# Patient Record
Sex: Female | Born: 1960 | Race: White | Hispanic: No | Smoking: Never smoker
Health system: Southern US, Community
[De-identification: ages and names within clinical notes are randomized; demographics above are authoritative.]

## PROBLEM LIST (undated history)

## (undated) DIAGNOSIS — E7800 Pure hypercholesterolemia, unspecified: Secondary | ICD-10-CM

## (undated) DIAGNOSIS — M797 Fibromyalgia: Secondary | ICD-10-CM

## (undated) DIAGNOSIS — M5430 Sciatica, unspecified side: Secondary | ICD-10-CM

## (undated) DIAGNOSIS — F4310 Post-traumatic stress disorder, unspecified: Secondary | ICD-10-CM

## (undated) DIAGNOSIS — F319 Bipolar disorder, unspecified: Secondary | ICD-10-CM

## (undated) DIAGNOSIS — M5137 Other intervertebral disc degeneration, lumbosacral region: Secondary | ICD-10-CM

## (undated) DIAGNOSIS — K449 Diaphragmatic hernia without obstruction or gangrene: Secondary | ICD-10-CM

## (undated) DIAGNOSIS — M51379 Other intervertebral disc degeneration, lumbosacral region without mention of lumbar back pain or lower extremity pain: Secondary | ICD-10-CM

## (undated) DIAGNOSIS — K219 Gastro-esophageal reflux disease without esophagitis: Secondary | ICD-10-CM

## (undated) DIAGNOSIS — I10 Essential (primary) hypertension: Secondary | ICD-10-CM

## (undated) DIAGNOSIS — F419 Anxiety disorder, unspecified: Secondary | ICD-10-CM

---

## 2018-04-26 ENCOUNTER — Encounter (HOSPITAL_COMMUNITY): Payer: Self-pay | Admitting: Emergency Medicine

## 2018-04-26 ENCOUNTER — Emergency Department (HOSPITAL_COMMUNITY): Payer: Medicare HMO

## 2018-04-26 ENCOUNTER — Other Ambulatory Visit: Payer: Self-pay

## 2018-04-26 ENCOUNTER — Observation Stay (HOSPITAL_COMMUNITY)
Admission: EM | Admit: 2018-04-26 | Discharge: 2018-04-28 | Disposition: A | Discharge status: Home / Self Care | Payer: Medicare HMO | Attending: Internal Medicine | Admitting: Internal Medicine

## 2018-04-26 DIAGNOSIS — M79642 Pain in left hand: Secondary | ICD-10-CM | POA: Insufficient documentation

## 2018-04-26 DIAGNOSIS — F319 Bipolar disorder, unspecified: Principal | ICD-10-CM | POA: Diagnosis present

## 2018-04-26 DIAGNOSIS — H539 Unspecified visual disturbance: Secondary | ICD-10-CM

## 2018-04-26 DIAGNOSIS — R251 Tremor, unspecified: Secondary | ICD-10-CM | POA: Diagnosis not present

## 2018-04-26 DIAGNOSIS — M797 Fibromyalgia: Secondary | ICD-10-CM | POA: Insufficient documentation

## 2018-04-26 DIAGNOSIS — I10 Essential (primary) hypertension: Secondary | ICD-10-CM | POA: Diagnosis present

## 2018-04-26 DIAGNOSIS — E876 Hypokalemia: Secondary | ICD-10-CM | POA: Diagnosis present

## 2018-04-26 DIAGNOSIS — Z79899 Other long term (current) drug therapy: Secondary | ICD-10-CM | POA: Diagnosis not present

## 2018-04-26 DIAGNOSIS — G8929 Other chronic pain: Secondary | ICD-10-CM | POA: Diagnosis not present

## 2018-04-26 DIAGNOSIS — F3010 Manic episode without psychotic symptoms, unspecified: Secondary | ICD-10-CM

## 2018-04-26 DIAGNOSIS — I160 Hypertensive urgency: Secondary | ICD-10-CM | POA: Diagnosis present

## 2018-04-26 DIAGNOSIS — R569 Unspecified convulsions: Secondary | ICD-10-CM | POA: Diagnosis not present

## 2018-04-26 DIAGNOSIS — E785 Hyperlipidemia, unspecified: Secondary | ICD-10-CM | POA: Diagnosis not present

## 2018-04-26 DIAGNOSIS — I161 Hypertensive emergency: Secondary | ICD-10-CM

## 2018-04-26 DIAGNOSIS — R000 Tachycardia, unspecified: Secondary | ICD-10-CM | POA: Diagnosis not present

## 2018-04-26 HISTORY — DX: Essential (primary) hypertension: I10

## 2018-04-26 HISTORY — DX: Bipolar disorder, unspecified: F31.9

## 2018-04-26 LAB — RAPID URINE DRUG SCREEN, HOSP PERFORMED
AMPHETAMINES: NOT DETECTED
Benzodiazepines: NOT DETECTED
Cocaine: NOT DETECTED
Opiates: NOT DETECTED
TETRAHYDROCANNABINOL: POSITIVE — AB

## 2018-04-26 LAB — URINALYSIS, ROUTINE W REFLEX MICROSCOPIC
BILIRUBIN URINE: NEGATIVE
Glucose, UA: NEGATIVE mg/dL
HGB URINE DIPSTICK: NEGATIVE
Ketones, ur: 5 mg/dL — AB
Nitrite: NEGATIVE
PROTEIN: 30 mg/dL — AB
Specific Gravity, Urine: 1.02 (ref 1.005–1.030)
pH: 5 (ref 5.0–8.0)

## 2018-04-26 LAB — CBC WITH DIFFERENTIAL/PLATELET
BASOS PCT: 0 %
Basophils Absolute: 0 10*3/uL (ref 0.0–0.1)
EOS ABS: 0 10*3/uL (ref 0.0–0.7)
Eosinophils Relative: 0 %
HCT: 44.2 % (ref 36.0–46.0)
Hemoglobin: 14.9 g/dL (ref 12.0–15.0)
LYMPHS ABS: 2.3 10*3/uL (ref 0.7–4.0)
Lymphocytes Relative: 13 %
MCH: 29.3 pg (ref 26.0–34.0)
MCHC: 33.7 g/dL (ref 30.0–36.0)
MCV: 87 fL (ref 78.0–100.0)
Monocytes Absolute: 1.3 10*3/uL — ABNORMAL HIGH (ref 0.1–1.0)
Monocytes Relative: 7 %
NEUTROS ABS: 14.9 10*3/uL — AB (ref 1.7–7.7)
NEUTROS PCT: 80 %
Platelets: 294 10*3/uL (ref 150–400)
RBC: 5.08 MIL/uL (ref 3.87–5.11)
RDW: 13.6 % (ref 11.5–15.5)
WBC: 18.6 10*3/uL — AB (ref 4.0–10.5)

## 2018-04-26 LAB — COMPREHENSIVE METABOLIC PANEL
ALBUMIN: 4.2 g/dL (ref 3.5–5.0)
ALK PHOS: 68 U/L (ref 38–126)
ALT: 34 U/L (ref 0–44)
AST: 36 U/L (ref 15–41)
Anion gap: 10 (ref 5–15)
BUN: 13 mg/dL (ref 6–20)
CALCIUM: 9.7 mg/dL (ref 8.9–10.3)
CO2: 23 mmol/L (ref 22–32)
CREATININE: 1.12 mg/dL — AB (ref 0.44–1.00)
Chloride: 109 mmol/L (ref 98–111)
GFR calc Af Amer: >60 mL/min (ref >60)
GFR calc non Af Amer: 53 mL/min — ABNORMAL LOW (ref >60)
GLUCOSE: 117 mg/dL — AB (ref 70–99)
Potassium: 2.8 mmol/L — ABNORMAL LOW (ref 3.5–5.1)
SODIUM: 142 mmol/L (ref 135–145)
Total Bilirubin: 0.7 mg/dL (ref 0.3–1.2)
Total Protein: 7.5 g/dL (ref 6.5–8.1)

## 2018-04-26 LAB — I-STAT CG4 LACTIC ACID, ED
Lactic Acid, Venous: 1.46 mmol/L (ref 0.5–1.9)
Lactic Acid, Venous: 2.86 mmol/L (ref 0.5–1.9)

## 2018-04-26 LAB — I-STAT TROPONIN, ED: TROPONIN I, POC: 0.02 ng/mL (ref 0.00–0.08)

## 2018-04-26 LAB — PROTIME-INR
INR: 0.93
Prothrombin Time: 12.3 seconds (ref 11.4–15.2)

## 2018-04-26 LAB — ETHANOL: Alcohol, Ethyl (B): <10 mg/dL (ref <10)

## 2018-04-26 LAB — MAGNESIUM: Magnesium: 2.2 mg/dL (ref 1.7–2.4)

## 2018-04-26 MED ORDER — LORAZEPAM 2 MG/ML IJ SOLN
1.0000 mg | Freq: Once | INTRAMUSCULAR | Status: AC
Start: 2018-04-26 — End: 2018-04-26
  Administered 2018-04-26: 1 mg via INTRAVENOUS
  Filled 2018-04-26: qty 1

## 2018-04-26 MED ORDER — SODIUM CHLORIDE 0.9 % IV BOLUS
1000.0000 mL | Freq: Once | INTRAVENOUS | Status: AC
  Administered 2018-04-26: 1000 mL via INTRAVENOUS

## 2018-04-26 NOTE — ED Notes (Signed)
Patient transported to CT 

## 2018-04-26 NOTE — ED Notes (Signed)
Pt back from CT scan

## 2018-04-26 NOTE — ED Notes (Signed)
Notified EDP,Tegeler,MD.pt. I-stat CG4 Lactic acid results 2.86 and RN,Sarah made aware and I-stat troponin results 0.02.

## 2018-04-26 NOTE — ED Triage Notes (Signed)
Pt here via EMS with c/o seizure like activity. EMS stated behavior was over before she got there. Pt has no noted hx of same. Pt is tachycardic at time of assessment. Pt is from [place] and was recently admitted at an impatient psych

## 2018-04-26 NOTE — ED Notes (Signed)
EDP with patient. Pt denies any pain aside from chronic pain. Provider aware of vital signs

## 2018-04-26 NOTE — ED Notes (Signed)
Pt's family is taking pt's belonging home with them.

## 2018-04-26 NOTE — ED Provider Notes (Signed)
[facility]-EMERGENCY DEPT Provider Note   CSN: [id number] Arrival date & time: 04/26/18  2027     History   Chief Complaint Chief Complaint  Patient presents with  . Manic Behavior    HPI Brandi Kerr is a 57 y.o. female.  The history is provided by the patient, a relative and medical records.  Seizures   This is a new problem. The current episode started less than 1 hour ago. The problem has been resolved. There was 1 seizure. The most recent episode lasted more than 5 minutes. Associated symptoms include visual disturbances (resolved). Pertinent negatives include no headaches, no speech difficulty, no neck stiffness, no sore throat, no chest pain, no cough, no nausea, no vomiting and no diarrhea. Characteristics include rhythmic jerking. The episode was witnessed. The seizures did not continue in the ED. The seizure(s) had no focality. Possible causes do not include missed seizure meds. There has been no fever. There were no medications administered prior to arrival.    No past medical history on file.  There are no active problems to display for this patient.   History reviewed. No pertinent surgical history.   OB History   None      Home Medications    Prior to Admission medications   Not on File    Family History No family history on file.  Social History Social History   Tobacco Use  . Smoking status: Not on file  Substance Use Topics  . Alcohol use: Not on file  . Drug use: Not on file     Allergies   Patient has no allergy information on record.   Review of Systems Review of Systems  Constitutional: Negative for chills, diaphoresis, fatigue and fever.  HENT: Negative for congestion and sore throat.   Eyes: Positive for visual disturbance (resolved). Negative for photophobia.  Respiratory: Negative for cough, chest tightness, shortness of breath, wheezing and stridor.   Cardiovascular: Negative for chest pain and  palpitations.  Gastrointestinal: Negative for abdominal pain, diarrhea, nausea and vomiting.  Genitourinary: Negative for dysuria, flank pain, frequency and urgency.  Musculoskeletal: Negative for back pain, neck pain and neck stiffness.  Skin: Negative for rash and wound.  Neurological: Positive for seizures. Negative for speech difficulty, weakness, light-headedness, numbness and headaches.  Psychiatric/Behavioral: Negative for agitation.  All other systems reviewed and are negative.    Physical Exam Updated Vital Signs BP (!) 161/106   Pulse (!) 117   Temp 98.3 F (36.8 C) (Oral)   Resp 20   SpO2 98%   Physical Exam  Constitutional: She is oriented to person, place, and time. She appears well-developed and well-nourished. No distress.  HENT:  Head: Normocephalic and atraumatic.  Mouth/Throat: Oropharynx is clear and moist. No oropharyngeal exudate.  Eyes: Pupils are equal, round, and reactive to light. Conjunctivae and EOM are normal.  Neck: Normal range of motion. Neck supple.  Cardiovascular: Regular rhythm. Tachycardia present.  No murmur heard. Pulmonary/Chest: Effort normal and breath sounds normal. No respiratory distress. She has no wheezes. She exhibits no tenderness.  Abdominal: Soft. There is no tenderness.  Musculoskeletal: She exhibits no edema or tenderness.  Lymphadenopathy:    She has no cervical adenopathy.  Neurological: She is alert and oriented to person, place, and time. She is not disoriented. She displays tremor. No cranial nerve deficit or sensory deficit. She exhibits normal muscle tone. Coordination normal. GCS eye subscore is 4. GCS verbal subscore is 5. GCS motor subscore is  6.  Patient anxious and tremulous in both upper extremities.  Skin: Skin is warm and dry. Capillary refill takes less than 2 seconds. No rash noted. She is not diaphoretic. No erythema.  Psychiatric: Her speech is rapid and/or pressured. She is not agitated and not aggressive.  She does not exhibit a depressed mood. She expresses no homicidal and no suicidal ideation.  Nursing note and vitals reviewed.    ED Treatments / Results  Labs (all labs ordered are listed, but only abnormal results are displayed) Labs Reviewed  CBC WITH DIFFERENTIAL/PLATELET - Abnormal; Notable for the following components:      Result Value   WBC 18.6 (*)    Neutro Abs 14.9 (*)    Monocytes Absolute 1.3 (*)    All other components within normal limits  COMPREHENSIVE METABOLIC PANEL - Abnormal; Notable for the following components:   Potassium 2.8 (*)    Glucose, Bld 117 (*)    Creatinine, Ser 1.12 (*)    GFR calc non Af Amer 53 (*)    All other components within normal limits  RAPID URINE DRUG SCREEN, HOSP PERFORMED - Abnormal; Notable for the following components:   Tetrahydrocannabinol POSITIVE (*)    Barbiturates   (*)    Value: Result not available. Reagent lot number recalled by manufacturer.   All other components within normal limits  URINALYSIS, ROUTINE W REFLEX MICROSCOPIC - Abnormal; Notable for the following components:   APPearance HAZY (*)    Ketones, ur 5 (*)    Protein, ur 30 (*)    Leukocytes, UA SMALL (*)    Bacteria, UA RARE (*)    All other components within normal limits  I-STAT CG4 LACTIC ACID, ED - Abnormal; Notable for the following components:   Lactic Acid, Venous 2.86 (*)    All other components within normal limits  URINE CULTURE  MAGNESIUM  ETHANOL  TSH  PROTIME-INR  I-STAT TROPONIN, ED  I-STAT CG4 LACTIC ACID, ED    EKG EKG Interpretation  Date/Time:  Wednesday April 26 2018 21:44:42 EDT Ventricular Rate:  113 PR Interval:  150 QRS Duration: 86 QT Interval:  342 QTC Calculation: 469 R Axis:   65 Text Interpretation:  Sinus tachycardia Otherwise normal ECG No prior ECG for comparison.  no STEMI Confirmed by Theda Belfast ([id number]) on 04/26/2018 10:06:46 PM   Radiology Dg Chest 2 View  Result Date: 04/27/2018 CLINICAL DATA:   57 year old female with tachycardia. EXAM: CHEST - 2 VIEW COMPARISON:  None. FINDINGS: The heart size and mediastinal contours are within normal limits. Both lungs are clear. The visualized skeletal structures are unremarkable. IMPRESSION: No active cardiopulmonary disease. Electronically Signed   By: Elgie Collard M.D.   On: 04/27/2018 00:02   Ct Head Wo Contrast  Result Date: 04/26/2018 CLINICAL DATA:  57 year old female with seizures. EXAM: CT HEAD WITHOUT CONTRAST TECHNIQUE: Contiguous axial images were obtained from the base of the skull through the vertex without intravenous contrast. COMPARISON:  None. FINDINGS: Brain: The ventricles and sulci appropriate size for patient's age. Minimal periventricular and deep white matter chronic microvascular ischemic changes noted. There is no acute intracranial hemorrhage. No mass effect or midline shift. No extra-axial fluid collection. Vascular: No hyperdense vessel or unexpected calcification. Skull: Normal. Negative for fracture or focal lesion. Sinuses/Orbits: No acute finding.  Right scleral band. Other: None IMPRESSION: No acute intracranial pathology. Electronically Signed   By: Elgie Collard M.D.   On: 04/26/2018 23:55    Procedures Procedures (including  critical care time)  Medications Ordered in ED Medications  LORazepam (ATIVAN) injection 1 mg (1 mg Intravenous Given 04/26/18 2250)  sodium chloride 0.9 % bolus 1,000 mL (0 mLs Intravenous Stopped 04/27/18 0041)  labetalol (NORMODYNE,TRANDATE) injection 10 mg (10 mg Intravenous Given 04/27/18 0037)     Initial Impression / Assessment and Plan / ED Course  I have reviewed the triage vital signs and the nursing notes.  Pertinent labs & imaging results that were available during my care of the patient were reviewed by me and considered in my medical decision making (see chart for details).     Megan MansLynn Coop is a 57 y.o. female with a past medical history significant for bipolar disorder,  hypertension, recent UTI status post antibiotics, and recent discharge for manic episode who presents with witnessed seizure.  Patient presents via EMS today for seizure episode.  According to patient, patient is from [place] and was discharged [facility] yesterday at 3 PM.  During her 5-day stay, patient was treated for manic episode and was also found to have a urinary tract infection.  Patient reports that she took a one-time antibiotic to treat this.  She says that she also has elevated blood pressures which is being managed with medications.  She reports that she has been taking her medications as directed.  Patient says that yesterday evening, patient began having flashes in her vision.  She went to the emergency department and was told it may be from blood pressure.  Patient was then discharged home.  Patient reports that she left [place] today and recently arrived to [place] with a friend.  While at home this afternoon, patient had a 10-minute seizure where the patient tensed up, phone with her mouth, eyes rolled back, and was unresponsive.  Friend reports that by the time EMS arrived, patient's seizure had stopped and she was postictal.  EMS report the patient was postictal during transport.  Patient was found to be hypertensive with blood pressures in the 180s and 190s as well as tachycardic in the 130s.  Patient denies any headache or vision changes at this time but is concerned about the seizure and recent vision change.  Patient says that she has not taken any drugs and does not drink alcohol.  She says she does not drink alcohol in 18 months.  Patient denies any other fevers, chills, neck pain, neck stiffness, traumatic injuries, chest pain, shortness breath, cough, nausea, vomiting, conservation, diarrhea, dysuria, or any other neurologic complaints.  On exam, patient is tachycardic and tachypneic.  Patient is very jittery and has tremors.  Patient does appear somewhat  hypomanic although she is able to answer questions appropriately.  Patient repeats herself and has some Kerr of ideas.  Family reports that she is very coherent for her at this time answering questions appropriately.    Lungs are clear and chest is nontender.  Patient had no focal neurologic deficits aside from the tremors.  Clinical I am concerned about hypertensive emergency causing her recent vision changes and possibly the seizure today.  As patient is never had a seizure before, patient will have CT head and laboratory testing.  Patient will also have work-up including UDS and alcohol despite patient reporting no episodes of drinking or drugs.  Given the vital sign abnormalities and her recent 10-minute seizure, anticipate patient will require admission for further work-up and management.  12:16 AM On my reassessment, patient again has a blood pressure in the 180s and is tachycardic  in the 110's despite fluid.  She will be given labetalol as I am concerned about hypertensive emergency contributing to symptoms.  Patient's other work-up began to return.  CT of the head showed no acute abnormalities and chest x-ray shows no pneumonia.  Lactic acid was initially elevated but then normalized after fluids.  INR not elevated.  Urinalysis shows no nitrites.  There were leukocytes and bacteria but there were also squamous cells and mucus.  I am concerned about contamination over UTI at this time in the absence of her urinary symptoms.  Troponin was not elevated.  CBC showed a leukocytosis however this may be demargination from the seizure.  Lactic acid also could be explained by the seizure.  Hemoglobin was normal.  Metabolic panel showed hypokalemia.  Patient will be given oral potassium.  Magnesium was normal and alcohol was negative.   Given the patient's persistent hypertension, her report of transient flashes in her vision and a 10-minute seizure today with no history of seizures as well as this  occurring while on Lamictal, I am concerned about the patient being discharged home.  Patient also was displaying signs of mild mania and may benefit from psychiatry evaluate her after she is medically cleared.  Given the patient's lack of headache or neck pain/neck stiffness, I have a lower suspicion for meningitis or infectious encephalopathy causing her seizure.  Hospitalist team will be called for admission for further management of Neurologic symptoms after hypertensive emergency.  12:30 AM Neurology was called and he wants to evaluate her personally to determine if she needs short-term or long-term EEG monitoring tonight.  This will determine which hospital she can be admitted to.  On my reassessment, patient appears more relaxed after the Ativan for her CT scan.  1:29 AM Neurology came to the bedside and feels she does not need long-term EEG monitoring.  They feel she needs short-term EEG and likely MRI in the morning.  Due to patient's claustrophobia, patient may need sedation for her MRI which can be arranged as an inpatient.  Neurology recommend continuing all home medicines.  Patient will be admitted to hospital service [facility] for further management of her hypertensive emergency and new seizure.   Final Clinical Impressions(s) / ED Diagnoses   Final diagnoses:  Seizures (HCC)  Manic behavior (HCC)  Transient vision disturbance  Hypertensive emergency  Hypokalemia     Clinical Impression: 1. Seizures (HCC)   2. Manic behavior (HCC)   3. Transient vision disturbance   4. Hypertensive emergency   5. Hypokalemia     Disposition: Admit  This note was prepared with assistance of Dragon voice recognition software. Occasional wrong-word or sound-a-like substitutions may have occurred due to the inherent limitations of voice recognition software.     Cassie Henkels, Canary Brim, MD 04/27/18 [phone]

## 2018-04-26 NOTE — ED Notes (Signed)
Bed: [id number] Expected date:  Expected time:  Means of arrival:  Comments: rm 29

## 2018-04-26 NOTE — ED Notes (Addendum)
CT tech attempted to take pt to CT scan; however, pt requested that she needed more time for Ativan to "kick in"

## 2018-04-27 ENCOUNTER — Observation Stay (HOSPITAL_COMMUNITY): Payer: Medicare HMO

## 2018-04-27 ENCOUNTER — Encounter (HOSPITAL_COMMUNITY): Payer: Self-pay | Admitting: Internal Medicine

## 2018-04-27 ENCOUNTER — Other Ambulatory Visit: Payer: Self-pay

## 2018-04-27 ENCOUNTER — Observation Stay (HOSPITAL_BASED_OUTPATIENT_CLINIC_OR_DEPARTMENT_OTHER): Payer: Medicare HMO

## 2018-04-27 DIAGNOSIS — F1290 Cannabis use, unspecified, uncomplicated: Secondary | ICD-10-CM

## 2018-04-27 DIAGNOSIS — R569 Unspecified convulsions: Secondary | ICD-10-CM | POA: Diagnosis not present

## 2018-04-27 DIAGNOSIS — I160 Hypertensive urgency: Secondary | ICD-10-CM | POA: Diagnosis present

## 2018-04-27 DIAGNOSIS — I10 Essential (primary) hypertension: Secondary | ICD-10-CM | POA: Diagnosis present

## 2018-04-27 DIAGNOSIS — F319 Bipolar disorder, unspecified: Secondary | ICD-10-CM | POA: Diagnosis present

## 2018-04-27 DIAGNOSIS — H539 Unspecified visual disturbance: Secondary | ICD-10-CM | POA: Diagnosis not present

## 2018-04-27 DIAGNOSIS — I5030 Unspecified diastolic (congestive) heart failure: Secondary | ICD-10-CM | POA: Diagnosis not present

## 2018-04-27 DIAGNOSIS — Z736 Limitation of activities due to disability: Secondary | ICD-10-CM

## 2018-04-27 DIAGNOSIS — Z79899 Other long term (current) drug therapy: Secondary | ICD-10-CM

## 2018-04-27 DIAGNOSIS — F3173 Bipolar disorder, in partial remission, most recent episode manic: Secondary | ICD-10-CM | POA: Diagnosis not present

## 2018-04-27 LAB — TSH: TSH: 0.757 u[IU]/mL (ref 0.350–4.500)

## 2018-04-27 LAB — ECHOCARDIOGRAM COMPLETE
Height: 67 in
Weight: 3739.2 oz

## 2018-04-27 LAB — GLUCOSE, CAPILLARY: Glucose-Capillary: 92 mg/dL (ref 70–99)

## 2018-04-27 MED ORDER — DULOXETINE HCL 60 MG PO CPEP
60.0000 mg | ORAL_CAPSULE | Freq: Every day | ORAL | Status: DC
  Administered 2018-04-27 – 2018-04-28 (×2): 60 mg via ORAL
  Filled 2018-04-27 (×2): qty 1

## 2018-04-27 MED ORDER — LAMOTRIGINE 100 MG PO TABS
200.0000 mg | ORAL_TABLET | Freq: Every day | ORAL | Status: DC
  Administered 2018-04-27 (×2): 200 mg via ORAL
  Filled 2018-04-27 (×2): qty 2

## 2018-04-27 MED ORDER — LOSARTAN POTASSIUM 50 MG PO TABS
100.0000 mg | ORAL_TABLET | Freq: Every day | ORAL | Status: DC
  Administered 2018-04-27 – 2018-04-28 (×2): 100 mg via ORAL
  Filled 2018-04-27 (×2): qty 2

## 2018-04-27 MED ORDER — TRAMADOL HCL 50 MG PO TABS
50.0000 mg | ORAL_TABLET | Freq: Two times a day (BID) | ORAL | Status: DC | PRN
  Administered 2018-04-27 – 2018-04-28 (×2): 50 mg via ORAL
  Filled 2018-04-27 (×3): qty 1

## 2018-04-27 MED ORDER — SODIUM CHLORIDE 0.9% FLUSH
3.0000 mL | Freq: Two times a day (BID) | INTRAVENOUS | Status: DC
  Administered 2018-04-27 – 2018-04-28 (×4): 3 mL via INTRAVENOUS

## 2018-04-27 MED ORDER — LABETALOL HCL 5 MG/ML IV SOLN
10.0000 mg | Freq: Once | INTRAVENOUS | Status: AC
  Administered 2018-04-27: 10 mg via INTRAVENOUS
  Filled 2018-04-27: qty 4

## 2018-04-27 MED ORDER — LORAZEPAM 2 MG/ML IJ SOLN
2.0000 mg | Freq: Once | INTRAMUSCULAR | Status: AC
  Administered 2018-04-27: 2 mg via INTRAVENOUS
  Filled 2018-04-27: qty 1

## 2018-04-27 MED ORDER — ROSUVASTATIN CALCIUM 20 MG PO TABS
20.0000 mg | ORAL_TABLET | Freq: Every day | ORAL | Status: DC
  Administered 2018-04-27: 20 mg via ORAL
  Filled 2018-04-27 (×2): qty 1

## 2018-04-27 MED ORDER — LABETALOL HCL 5 MG/ML IV SOLN
10.0000 mg | INTRAVENOUS | Status: DC | PRN
  Filled 2018-04-27: qty 4

## 2018-04-27 MED ORDER — GADOBENATE DIMEGLUMINE 529 MG/ML IV SOLN
20.0000 mL | Freq: Once | INTRAVENOUS | Status: AC | PRN
  Administered 2018-04-27: 20 mL via INTRAVENOUS

## 2018-04-27 MED ORDER — ENOXAPARIN SODIUM 40 MG/0.4ML ~~LOC~~ SOLN
40.0000 mg | SUBCUTANEOUS | Status: DC
  Administered 2018-04-27 – 2018-04-28 (×2): 40 mg via SUBCUTANEOUS
  Filled 2018-04-27 (×2): qty 0.4

## 2018-04-27 MED ORDER — AMLODIPINE BESYLATE 5 MG PO TABS
5.0000 mg | ORAL_TABLET | Freq: Every day | ORAL | Status: DC
  Administered 2018-04-27 – 2018-04-28 (×2): 5 mg via ORAL
  Filled 2018-04-27 (×2): qty 1

## 2018-04-27 NOTE — ED Notes (Signed)
ED TO INPATIENT HANDOFF REPORT  Name/Age/Gender Clarene Duke 57 y.o. female  Code Status    Code Status Orders  (From admission, onward)        Start     Ordered   04/27/18 0155  Full code  Continuous     04/27/18 0157    Code Status History    This patient has a current code status but no historical code status.      Home/SNF/Other Home  Chief Complaint Seizures  Level of Care/Admitting Diagnosis ED Disposition    ED Disposition Condition Comment   Admit  Hospital Area: [place] [[id number]]  Level of Care: Telemetry [5]  Admit to tele based on following criteria: Eval of Syncope  Diagnosis: Hypertensive urgency [675916]  Admitting Physician: Etta Quill [[id number]]  Attending Physician: Etta Quill [[id number]]  PT Class (Do Not Modify): Observation [104]  PT Acc Code (Do Not Modify): Observation [10022]       Medical History Past Medical History:  Diagnosis Date  . Bipolar disorder ([place])   . HTN (hypertension)     Allergies No Known Allergies  IV Location/Drains/Wounds Patient Lines/Drains/Airways Status   Active Line/Drains/Airways    Name:   Placement date:   Placement time:   Site:   Days:   Peripheral IV 04/26/18 Right Antecubital   04/26/18    2201    Antecubital   1          Labs/Imaging Results for orders placed or performed during the hospital encounter of 04/26/18 (from the past 48 hour(s))  Ethanol     Status: None   Collection Time: 04/26/18  9:12 PM  Result Value Ref Range   Alcohol, Ethyl (B) <10 <10 mg/dL    Comment: (NOTE) Lowest detectable limit for serum alcohol is 10 mg/dL. For medical purposes only. Performed at [facility], [place] [address] [place], [place] [address]   TSH     Status: None   Collection Time: 04/26/18  9:12 PM  Result Value Ref Range   TSH 0.757 0.350 - 4.500 uIU/mL    Comment: Performed by a 3rd Generation assay with a functional sensitivity of <=0.01  uIU/mL. Performed at [facility], [place] [address] [place], [place] [address]   CBC with Differential     Status: Abnormal   Collection Time: 04/26/18 10:00 PM  Result Value Ref Range   WBC 18.6 (H) 4.0 - 10.5 K/uL   RBC 5.08 3.87 - 5.11 MIL/uL   Hemoglobin 14.9 12.0 - 15.0 g/dL   HCT 44.2 36.0 - 46.0 %   MCV 87.0 78.0 - 100.0 fL   MCH 29.3 26.0 - 34.0 pg   MCHC 33.7 30.0 - 36.0 g/dL   RDW 13.6 11.5 - 15.5 %   Platelets 294 150 - 400 K/uL   Neutrophils Relative % 80 %   Neutro Abs 14.9 (H) 1.7 - 7.7 K/uL   Lymphocytes Relative 13 %   Lymphs Abs 2.3 0.7 - 4.0 K/uL   Monocytes Relative 7 %   Monocytes Absolute 1.3 (H) 0.1 - 1.0 K/uL   Eosinophils Relative 0 %   Eosinophils Absolute 0.0 0.0 - 0.7 K/uL   Basophils Relative 0 %   Basophils Absolute 0.0 0.0 - 0.1 K/uL    Comment: Performed at [facility], [place] [address] [place], [place] [address]  Comprehensive metabolic panel     Status: Abnormal   Collection Time: 04/26/18 10:00 PM  Result Value Ref Range   Sodium 142 135 - 145 mmol/L   Potassium 2.8 (L) 3.5 - 5.1 mmol/L   Chloride 109 98 - 111 mmol/L    Comment: Please note change in reference range.   CO2 23 22 - 32 mmol/L   Glucose, Bld 117 (H) 70 - 99 mg/dL    Comment: Please note change in reference range.   BUN 13 6 - 20 mg/dL    Comment: Please note change in reference range.   Creatinine, Ser 1.12 (H) 0.44 - 1.00 mg/dL   Calcium 9.7 8.9 - 10.3 mg/dL   Total Protein 7.5 6.5 - 8.1 g/dL   Albumin 4.2 3.5 - 5.0 g/dL   AST 36 15 - 41 U/L   ALT 34 0 - 44 U/L    Comment: Please note change in reference range.   Alkaline Phosphatase 68 38 - 126 U/L   Total Bilirubin 0.7 0.3 - 1.2 mg/dL   GFR calc non Af Amer 53 (L) >60 mL/min   GFR calc Af Amer >60 >60 mL/min    Comment: (NOTE) The eGFR has been calculated using the CKD EPI equation. This calculation has not been validated in all clinical situations. eGFR's persistently <60  mL/min signify possible Chronic Kidney Disease.    Anion gap 10 5 - 15    Comment: Performed at [facility], [place] [address] [place], [place] [address]  Magnesium     Status: None   Collection Time: 04/26/18 10:00 PM  Result Value Ref Range   Magnesium 2.2 1.7 - 2.4 mg/dL    Comment: Performed at [facility], [place] [address] [place], [place] [address]  I-Stat Troponin, ED (not at [facility])     Status: None   Collection Time: 04/26/18 10:10 PM  Result Value Ref Range   Troponin i, poc 0.02 0.00 - 0.08 ng/mL   Comment 3            Comment: Due to the release kinetics of cTnI, a negative result within the first hours of the onset of symptoms does not rule out myocardial infarction with certainty. If myocardial infarction is still suspected, repeat the test at appropriate intervals.   I-Stat CG4 Lactic Acid, ED     Status: Abnormal   Collection Time: 04/26/18 10:11 PM  Result Value Ref Range   Lactic Acid, Venous 2.86 (HH) 0.5 - 1.9 mmol/L   Comment NOTIFIED PHYSICIAN   Rapid urine drug screen (hospital performed)     Status: Abnormal   Collection Time: 04/26/18 10:36 PM  Result Value Ref Range   Opiates NONE DETECTED NONE DETECTED   Cocaine NONE DETECTED NONE DETECTED   Benzodiazepines NONE DETECTED NONE DETECTED   Amphetamines NONE DETECTED NONE DETECTED   Tetrahydrocannabinol POSITIVE (A) NONE DETECTED   Barbiturates (A) NONE DETECTED    Result not available. Reagent lot number recalled by manufacturer.    Comment: Performed at [facility], [place] [address], [place], [place] [address]  Urinalysis, Routine w reflex microscopic     Status: Abnormal   Collection Time: 04/26/18 10:36 PM  Result Value Ref Range   Color, Urine YELLOW YELLOW   APPearance HAZY (A) CLEAR   Specific Gravity, Urine 1.020 1.005 - 1.030   pH 5.0 5.0 - 8.0   Glucose, UA NEGATIVE NEGATIVE mg/dL   Hgb urine dipstick NEGATIVE NEGATIVE   Bilirubin Urine  NEGATIVE NEGATIVE   Ketones, ur 5 (A) NEGATIVE mg/dL  Protein, ur 30 (A) NEGATIVE mg/dL   Nitrite NEGATIVE NEGATIVE   Leukocytes, UA SMALL (A) NEGATIVE   RBC / HPF 0-5 0 - 5 RBC/hpf   WBC, UA 11-20 0 - 5 WBC/hpf   Bacteria, UA RARE (A) NONE SEEN   Squamous Epithelial / LPF 6-10 0 - 5   Mucus PRESENT    Hyaline Casts, UA PRESENT     Comment: Performed at [facility], [place] [address], [place], [place] [address]  Protime-INR     Status: None   Collection Time: 04/26/18 10:46 PM  Result Value Ref Range   Prothrombin Time 12.3 11.4 - 15.2 seconds   INR 0.93     Comment: Performed at [facility], [place] [address] [place],  [address]  I-Stat CG4 Lactic Acid, ED     Status: None   Collection Time: 04/26/18 11:41 PM  Result Value Ref Range   Lactic Acid, Venous 1.46 0.5 - 1.9 mmol/L   Dg Chest 2 View  Result Date: 04/27/2018 CLINICAL DATA:  57 year old female with tachycardia. EXAM: CHEST - 2 VIEW COMPARISON:  None. FINDINGS: The heart size and mediastinal contours are within normal limits. Both lungs are clear. The visualized skeletal structures are unremarkable. IMPRESSION: No active cardiopulmonary disease. Electronically Signed   By: Anner Crete M.D.   On: 04/27/2018 00:02   Ct Head Wo Contrast  Result Date: 04/26/2018 CLINICAL DATA:  57 year old female with seizures. EXAM: CT HEAD WITHOUT CONTRAST TECHNIQUE: Contiguous axial images were obtained from the base of the skull through the vertex without intravenous contrast. COMPARISON:  None. FINDINGS: Brain: The ventricles and sulci appropriate size for patient's age. Minimal periventricular and deep white matter chronic microvascular ischemic changes noted. There is no acute intracranial hemorrhage. No mass effect or midline shift. No extra-axial fluid collection. Vascular: No hyperdense vessel or unexpected calcification. Skull: Normal. Negative for fracture or focal lesion. Sinuses/Orbits:  No acute finding.  Right scleral band. Other: None IMPRESSION: No acute intracranial pathology. Electronically Signed   By: Anner Crete M.D.   On: 04/26/2018 23:55    Pending Labs Unresulted Labs (From admission, onward)   Start     Ordered   04/27/18 0155  HIV antibody (Routine Testing)  Once,   R     04/27/18 0157   04/26/18 2112  Urine culture  STAT,   STAT     04/26/18 2111      Vitals/Pain Today's Vitals   04/27/18 0145 04/27/18 0204 04/27/18 0205 04/27/18 0206  BP: 137/72 140/73    Pulse:   (!) 104   Resp: 20 17 (!) 22   Temp:    98.3 F (36.8 C)  TempSrc:    Oral  SpO2:   97%   PainSc:        Isolation Precautions No active isolations  Medications Medications  labetalol (NORMODYNE,TRANDATE) injection 10 mg (has no administration in time range)  lamoTRIgine (LAMICTAL) tablet 200 mg (has no administration in time range)  DULoxetine (CYMBALTA) DR capsule 60 mg (has no administration in time range)  amLODipine (NORVASC) tablet 5 mg (has no administration in time range)  losartan (COZAAR) tablet 100 mg (has no administration in time range)  rosuvastatin (CRESTOR) tablet 20 mg (has no administration in time range)  sodium chloride flush (NS) 0.9 % injection 3 mL (has no administration in time range)  enoxaparin (LOVENOX) injection 40 mg (has no administration in time range)  LORazepam (ATIVAN) injection 1 mg (1  mg Intravenous Given 04/26/18 2250)  sodium chloride 0.9 % bolus 1,000 mL (0 mLs Intravenous Stopped 04/27/18 0041)  labetalol (NORMODYNE,TRANDATE) injection 10 mg (10 mg Intravenous Given 04/27/18 0037)    Mobility walks with person assist

## 2018-04-27 NOTE — Progress Notes (Deleted)
Subjective: Patient very drowsy from Ativan due to having MRI this AM. No complaints. No further episode.   Exam: Vitals:   04/27/18 0301 04/27/18 0603  BP: 129/85 (!) 152/93  Pulse: 92 98  Resp: 19 18  Temp: 98.7 F (37.1 C) 98.3 F (36.8 C)  SpO2: 92% 95%    Physical Exam   HEENT-  Normocephalic, no lesions, without obvious abnormality.  Normal external eye and conjunctiva.   Extremities- Warm, dry and intact Musculoskeletal-no joint tenderness, deformity or swelling Skin-warm and dry, no hyperpigmentation, vitiligo, or suspicious lesions    Neuro:  Mental Status: Alert, oriented, thought content appropriate.  Speech fluent without evidence of aphasia.  Able to follow 3 step commands without difficulty. Cranial Nerves: II:  Visual fields grossly normal,  III,IV, VI: ptosis not present, extra-ocular motions intact bilaterally pupils equal, round, reactive to light and accommodation V,VII: smile symmetric, facial light touch sensation normal bilaterally VIII: hearing normal bilaterally IX,X: uvula rises symmetrically XI: bilateral shoulder shrug XII: midline tongue extension Motor:- no significant changes on motor exam  5/5 strength in all 4 extremities with mildly increased tone and tremor noted on the right and left hand but left worse than tight, baseline for her.  She did have some cogwheeling versus paratonia in the left upper extremity.  Tone was normal in all other extremities. Sensory: Pinprick and light touch intact throughout, bilaterally Deep Tendon Reflexes: 2+ and symmetric throughout Plantars: Right: downgoing   Left: downgoing Cerebellar: normal finger-to-nose,and normal heel-to-shin test   Medications:  Scheduled: . amLODipine  5 mg Oral Daily  . DULoxetine  60 mg Oral Daily  . enoxaparin (LOVENOX) injection  40 mg Subcutaneous Q24H  . lamoTRIgine  200 mg Oral QHS  . losartan  100 mg Oral Daily  . rosuvastatin  20 mg Oral Daily  . sodium chloride  flush  3 mL Intravenous Q12H    Pertinent Labs/Diagnostics: MRI WNL EEG pending  Dg Chest 2 View  Result Date: 04/27/2018 CLINICAL DATA:  57 year old female with tachycardia. EXAM: CHEST - 2 VIEW COMPARISON:  None. FINDINGS: The heart size and mediastinal contours are within normal limits. Both lungs are clear. The visualized skeletal structures are unremarkable. IMPRESSION: No active cardiopulmonary disease. Electronically Signed   By: Elgie CollardArash  Radparvar M.D.   On: 04/27/2018 00:02   Ct Head Wo Contrast  Result Date: 04/26/2018 CLINICAL DATA:  57 year old female with seizures. EXAM: CT HEAD WITHOUT CONTRAST TECHNIQUE: Contiguous axial images were obtained from the base of the skull through the vertex without intravenous contrast. COMPARISON:  None. FINDINGS: Brain: The ventricles and sulci appropriate size for patient's age. Minimal periventricular and deep white matter chronic microvascular ischemic changes noted. There is no acute intracranial hemorrhage. No mass effect or midline shift. No extra-axial fluid collection. Vascular: No hyperdense vessel or unexpected calcification. Skull: Normal. Negative for fracture or focal lesion. Sinuses/Orbits: No acute finding.  Right scleral band. Other: None IMPRESSION: No acute intracranial pathology. Electronically Signed   By: Elgie CollardArash  Radparvar M.D.   On: 04/26/2018 23:55   Mr Laqueta JeanBrain W NWWo Contrast  Result Date: 04/27/2018 CLINICAL DATA:  Seizure like activity. EXAM: MRI HEAD WITHOUT AND WITH CONTRAST TECHNIQUE: Multiplanar, multiecho pulse sequences of the brain and surrounding structures were obtained without and with intravenous contrast. CONTRAST:  20mL MULTIHANCE GADOBENATE DIMEGLUMINE 529 MG/ML IV SOLN COMPARISON:  Head CT 04/26/2018 FINDINGS: The study is moderately motion degraded. Brain: There is no evidence of acute infarct, intracranial hemorrhage, mass, midline shift,  or extra-axial fluid collection. The ventricles and sulci are normal. The  hippocampi are grossly symmetric in size and signal, however detailed assessment is precluded by the degree of motion artifact. No significant brain parenchymal signal abnormality is identified. No abnormal enhancement is identified. Vascular: Major intracranial vascular flow voids are preserved. Skull and upper cervical spine: Unremarkable bone marrow signal. Sinuses/Orbits: Right scleral buckle. Paranasal sinuses and mastoid air cells are clear. Other: None. IMPRESSION: Negative brain MRI within limitations of motion. Electronically Signed   By: Sebastian Ache M.D.   On: 04/27/2018 09:42     Felicie Morn PA-C [facility] [phone]   Assessment:--no change  57 year old woman with a past medical history of bipolar disorder with recent psychiatric admission for mania, currently with somatic symptoms, presented to the emergency room for evaluation of seizure-like episode at a friend's place. The description does sound concerning for a seizure but there was an absence of any tongue biting or bowel bladder incontinence which with a history of psychiatric disroder, can also point towards a non-organic cause of the seizure. Nonetheless, she is on medications that might lead lower seizure threshold-Cymbalta. She needs work-up for seizures.  Recommendations: --EEG --seizure precautions --IF further seizures likely will need alternative than Cymbalta for her fibromyalgia --Per [facility] statutes, patients with seizures are not allowed to drive until  they have been seizure-free for six months. Use caution when using heavy equipment or power tools. Avoid working on ladders or at heights. Take showers instead of baths. Ensure the water temperature is not too high on the home water heater. Do not go swimming alone. When caring for infants or small children, sit down when holding, feeding, or changing them to minimize risk of injury to the child in the event you have a seizure.       04/27/2018, 9:58 AM

## 2018-04-27 NOTE — Progress Notes (Addendum)
Brandi Kerr is a 57 y.o. female with medical history significant of bipolar disorder currently experiencing manic symptoms for which she was hospitalized in [place] and discharged yesterday, HTN, HLD.  [facility] seizure like event.  Manic episode has resolved and is cleared by psych.  Highly appreciate recommendations.  Continue current medications.  Please refer to H&P dictated by Dr. Julian ReilGardner on 04/27/2018 for further details of the assessment and plan.

## 2018-04-27 NOTE — Progress Notes (Signed)
Subjective: Patient very drowsy from Ativan due to having MRI this AM. No complaints. No further episode.   Exam: Vitals:   04/27/18 0301 04/27/18 0603  BP: 129/85 (!) 152/93  Pulse: 92 98  Resp: 19 18  Temp: 98.7 F (37.1 C) 98.3 F (36.8 C)  SpO2: 92% 95%    Physical Exam   HEENT-  Normocephalic, no lesions, without obvious abnormality.  Normal external eye and conjunctiva.   Extremities- Warm, dry and intact Musculoskeletal-no joint tenderness, deformity or swelling Skin-warm and dry, no hyperpigmentation, vitiligo, or suspicious lesions    Neuro:  Mental Status: Alert, oriented, thought content appropriate.  Speech fluent without evidence of aphasia.  Able to follow 3 step commands without difficulty. Cranial Nerves: II:  Visual fields grossly normal,  III,IV, VI: ptosis not present, extra-ocular motions intact bilaterally pupils equal, round, reactive to light and accommodation V,VII: smile symmetric, facial light touch sensation normal bilaterally VIII: hearing normal bilaterally IX,X: uvula rises symmetrically XI: bilateral shoulder shrug XII: midline tongue extension Motor:- no significant changes on motor exam  5/5 strength in all 4 extremities with mildly increased tone and tremor noted on the right and left hand but left worse than tight, baseline for her.  She did have some cogwheeling versus paratonia in the left upper extremity.  Tone was normal in all other extremities. Sensory: Pinprick and light touch intact throughout, bilaterally Deep Tendon Reflexes: 2+ and symmetric throughout Plantars: Right: downgoing   Left: downgoing Cerebellar: normal finger-to-nose,and normal heel-to-shin test   Medications:  Scheduled: . amLODipine  5 mg Oral Daily  . DULoxetine  60 mg Oral Daily  . enoxaparin (LOVENOX) injection  40 mg Subcutaneous Q24H  . lamoTRIgine  200 mg Oral QHS  . losartan  100 mg Oral Daily  . rosuvastatin  20 mg Oral Daily  . sodium chloride  flush  3 mL Intravenous Q12H    Pertinent Labs/Diagnostics: MRI WNL EEG pending  Dg Chest 2 View  Result Date: 04/27/2018 CLINICAL DATA:  57 year old female with tachycardia. EXAM: CHEST - 2 VIEW COMPARISON:  None. FINDINGS: The heart size and mediastinal contours are within normal limits. Both lungs are clear. The visualized skeletal structures are unremarkable. IMPRESSION: No active cardiopulmonary disease. Electronically Signed   By: Elgie CollardArash  Radparvar M.D.   On: 04/27/2018 00:02   Ct Head Wo Contrast  Result Date: 04/26/2018 CLINICAL DATA:  57 year old female with seizures. EXAM: CT HEAD WITHOUT CONTRAST TECHNIQUE: Contiguous axial images were obtained from the base of the skull through the vertex without intravenous contrast. COMPARISON:  None. FINDINGS: Brain: The ventricles and sulci appropriate size for patient's age. Minimal periventricular and deep white matter chronic microvascular ischemic changes noted. There is no acute intracranial hemorrhage. No mass effect or midline shift. No extra-axial fluid collection. Vascular: No hyperdense vessel or unexpected calcification. Skull: Normal. Negative for fracture or focal lesion. Sinuses/Orbits: No acute finding.  Right scleral band. Other: None IMPRESSION: No acute intracranial pathology. Electronically Signed   By: Elgie CollardArash  Radparvar M.D.   On: 04/26/2018 23:55   Mr Laqueta JeanBrain W NWWo Contrast  Result Date: 04/27/2018 CLINICAL DATA:  Seizure like activity. EXAM: MRI HEAD WITHOUT AND WITH CONTRAST TECHNIQUE: Multiplanar, multiecho pulse sequences of the brain and surrounding structures were obtained without and with intravenous contrast. CONTRAST:  20mL MULTIHANCE GADOBENATE DIMEGLUMINE 529 MG/ML IV SOLN COMPARISON:  Head CT 04/26/2018 FINDINGS: The study is moderately motion degraded. Brain: There is no evidence of acute infarct, intracranial hemorrhage, mass, midline shift,  or extra-axial fluid collection. The ventricles and sulci are normal. The  hippocampi are grossly symmetric in size and signal, however detailed assessment is precluded by the degree of motion artifact. No significant brain parenchymal signal abnormality is identified. No abnormal enhancement is identified. Vascular: Major intracranial vascular flow voids are preserved. Skull and upper cervical spine: Unremarkable bone marrow signal. Sinuses/Orbits: Right scleral buckle. Paranasal sinuses and mastoid air cells are clear. Other: None. IMPRESSION: Negative brain MRI within limitations of motion. Electronically Signed   By: Sebastian AcheAllen  Grady M.D.   On: 04/27/2018 09:42       Assessment:--no change  57 year old woman with a past medical history of bipolar disorder with recent psychiatric admission for mania, currently with somatic symptoms, presented to the emergency room for evaluation of seizure-like episode at a friend's place. The description does sound concerning for a seizure but there was an absence of any tongue biting or bowel bladder incontinence which with a history of psychiatric disroder, can also point towards a non-organic cause of the seizure. Nonetheless, she is on medications that might lead lower seizure threshold-Cymbalta. She needs work-up for seizures.  Recommendations: --EEG --seizure precautions --IF further seizures likely will need alternative than Cymbalta for her fibromyalgia --[place] [facility] [place], patients with seizures are not allowed to drive until  they have been seizure-free for six months. Use caution when using heavy equipment or power tools. Avoid working on ladders or at heights. Take showers instead of baths. Ensure the water temperature is not too high on the home water heater. Do not go swimming alone. When caring for infants or small children, sit down when holding, feeding, or changing them to minimize risk of injury to the child in the event you have a seizure.    Felicie MornDavid Tonio Seider PA-C [facility] [phone]   04/27/2018, 9:58 AM

## 2018-04-27 NOTE — Consult Note (Addendum)
Neurology Consultation  Reason for Consult: Seizure Referring Physician: C. Tegeler, MD  CC: Altered mental status, seizure-like episode  History is obtained from: Patient, patient's friend at bedside  HPI: Brandi Kerr is a 57 y.o. female past medical history of bipolar disorder currently experiencing manic symptoms for which she was hospitalized in [place] and discharged yesterday, hypertension and hyperlipidemia, presented to the emergency room for evaluation of a seizure-like event. She [place] to [place] this evening, they were having dinner together in her house at her kitchen bar, when suddenly the patient started to slip out of the barstool.  The friend helped her down to ensure that she did not fall or hit her head.  She then noted that patient clenching her arms and fists and becoming stiff all over, her eyes rolling up followed by generalized body jerking movements that lasted a minute or so.  There was no bowel bladder incontinence.  No tongue bite.  The patient continued to be altered for about 10 to 15 minutes after this episode and then started to come around.  She was brought into the emergency room for emergent assessment for the seizure-like activity. It is difficult to obtain a reasonable history from the patient because of her tangential thought process at this time but the friend accompanying her seem to be a reliable historian. The patient has been on medications for her psychiatric illness-Lamictal for the past 10 years and Cymbalta for the past 8 to 10 months.  She reports complete compliance to all medications.  Denies any illicit drug use or alcohol abuse. I have no prior notes on her in our system to review. I do not have access to her records from her psychiatric hospitalization in [place] last week She denied any chest pain, shortness of breath, current visual symptoms, headache.  She denies any history of migrainous headaches.  She denies any suicidal  or homicidal ideations at this time.  Reports feeling manic at this time.  Denies any depression.  Patient also reported that yesterday, she went to another ER somewhere, because of some visual symptoms.  She reported she was reading something and saw a watermark like figure in her line of sight that lasted for about a few seconds.  She was very concerned about the symptoms as it was seen in the ER.  Today in the emergency room, her systolic blood pressures have been high-in the 180s to 190s. Her labs revealed elevated lactate which normalized after IV fluids.  She had a leukocytosis of a white count of 18.6.  Also has trace leuk esterase in her urine.   ROS: ROS was performed and is negative except as noted in the HPI.   History reviewed. No pertinent past medical history. Documented above  No family history on file.  Social History:   has no tobacco, alcohol, and drug history on file. Denies tobacco, illicit drug or alcohol abuse.  Medications No current facility-administered medications for this encounter.   Current Outpatient Medications:  .  amLODipine (NORVASC) 5 MG tablet, Take 5 mg by mouth daily., Disp: , Rfl:  .  DULoxetine (CYMBALTA) 60 MG capsule, Take 60 mg by mouth daily., Disp: , Rfl:  .  lamoTRIgine (LAMICTAL) 200 MG tablet, Take 200 mg by mouth at bedtime., Disp: , Rfl:  .  losartan (COZAAR) 100 MG tablet, Take 100 mg by mouth daily., Disp: , Rfl:  .  rosuvastatin (CRESTOR) 20 MG tablet, Take 20 mg by mouth daily.,  Disp: , Rfl:  .  traMADol (ULTRAM) 50 MG tablet, Take 50 mg by mouth 2 (two) times daily as needed for pain., Disp: , Rfl:    Exam: Current vital signs: BP 137/72   Pulse 88   Temp 98.3 F (36.8 C) (Oral)   Resp 20   SpO2 91%  Vital signs in last 24 hours: Temp:  [98.3 F (36.8 C)] 98.3 F (36.8 C) (06/26 2049) Pulse Rate:  [88-130] 88 (06/27 0100) Resp:  [17-27] 20 (06/27 0145) BP: (137-187)/(72-129) 137/72 (06/27 0145) SpO2:  [91 %-98 %] 91  % (06/27 0100) General: Patient is awake, alert in no apparent distress H ENT: Normocephalic, atraumatic, dry oral mucous membranes Lungs: Clear to auscultation Cardiovascular: S1-S2 heard, regular rate rhythm Extremities: Warm well perfused with intact pulses Neurological exam Next line patient is awake alert oriented x3. Her speech is clear with no dysarthria.  Speech appears a little pressured at times. Naming, attention and repetition intact. Cranial nerves: Pupils equal round reactive to light, extraocular movement intact, face symmetric, visual fields full, tongue midline, palate elevates symmetrically, shoulder shrug intact. Motor exam: 5/5 strength in all 4 extremities with mildly increased tone and tremor noted on the left hand, which she says it at baseline and exacerbated by pain and anxiety.  She did have some cogwheeling versus paratonia in the left upper extremity.  Tone was normal in all other extremities. Sensory exam: Intact light touch with no extinction. Coordination: Intact in the nose finger on the right, on the left, mild dysmetria because of the tremor that was present at rest and on action. Gait testing was deferred at this time.   Labs I have reviewed labs in epic and the results pertinent to this consultation are:   CBC    Component Value Date/Time   WBC 18.6 (H) 04/26/2018 2200   RBC 5.08 04/26/2018 2200   HGB 14.9 04/26/2018 2200   HCT 44.2 04/26/2018 2200   PLT 294 04/26/2018 2200   MCV 87.0 04/26/2018 2200   MCH 29.3 04/26/2018 2200   MCHC 33.7 04/26/2018 2200   RDW 13.6 04/26/2018 2200   LYMPHSABS 2.3 04/26/2018 2200   MONOABS 1.3 (H) 04/26/2018 2200   EOSABS 0.0 04/26/2018 2200   BASOSABS 0.0 04/26/2018 2200    CMP     Component Value Date/Time   NA 142 04/26/2018 2200   K 2.8 (L) 04/26/2018 2200   CL 109 04/26/2018 2200   CO2 23 04/26/2018 2200   GLUCOSE 117 (H) 04/26/2018 2200   BUN 13 04/26/2018 2200   CREATININE 1.12 (H) 04/26/2018  2200   CALCIUM 9.7 04/26/2018 2200   PROT 7.5 04/26/2018 2200   ALBUMIN 4.2 04/26/2018 2200   AST 36 04/26/2018 2200   ALT 34 04/26/2018 2200   ALKPHOS 68 04/26/2018 2200   BILITOT 0.7 04/26/2018 2200   GFRNONAA 53 (L) 04/26/2018 2200   GFRAA >60 04/26/2018 2200  UA-?  Leukocyte esterase positive, multiple squamous cells, multiple white cells.  Rare bacteria.  Imaging I have reviewed the images obtained: CT-scan of the brain-no acute abnormality noted.  Assessment:  57 year old woman with a past medical history of bipolar disorder with recent psychiatric admission for mania, currently with somatic symptoms, presented to the emergency room for evaluation of seizure-like episode at a friend's place. The description does sound concerning for a seizure but there was an absence of any tongue biting or bowel bladder incontinence which with a history of psychiatric disroder, can also point  towards a non-organic cause of the seizure. Nonetheless, she is on medications that might lead lower seizure threshold-Cymbalta. She needs work-up for seizures. Her examination also reveals tremor in her left hand, which she says is at baseline with some worsening when she is tired or has pain.  She also has chronic pain in that hand.  I do not think that she is having excessive extrapyramidal symptoms from her Cymbalta at this time.  And also she has expressed very clearly that Cymbalta has helped her a lot and she would like to continue that medication unless it is absolutely necessary to come off of it.  Impression: Evaluate for seizure-like activity Evaluate for drug-induced parkinsonism Low suspicion but evaluate for serotonin syndrome (did have seizrue like activity, HTN - but pressures coming down spontaneously)  Recommendations: MRI of the brain-she is extremely claustrophobic.  Might need some sedation to get the MRI done. Routine EEG in the morning Maintain seizure precautions No driving unless 6  months seizure-free per [facility]. I would not recommend any changes to her medications at this time.  Continue with her home meds. She is on Lamictal, Cymbalta.  I will continue them for now. Inpatient psychiatry consult Neurology will continue to follow with you. -- Milon Dikes, MD [facility] Pager: [phone] If 7pm to 7am, please call on call as listed on AMION.

## 2018-04-27 NOTE — H&P (Signed)
History and Physical    Brandi Kerr WUJ:[id number] DOB: 10-03-1961 DOA: 04/26/2018  PCP: Patient, No Pcp Per  Patient coming from: Home  I have personally briefly reviewed patient's old medical records in [facility]  Chief Complaint: Seizure  HPI: Brandi Kerr is a 57 y.o. female with medical history significant of bipolar disorder currently experiencing manic symptoms for which she was hospitalized in [place] and discharged yesterday, HTN, HLD.  Patient presented to the ED at [facility] for evaluation of seizure like event.  She drove to a friend here in [place] this evening, they were having dinner together in her house at her kitchen bar, when suddenly the patient started to slip out of the barstool.  The friend helped her down to ensure that she did not fall or hit her head.  She then noted that patient clenching her arms and fists and becoming stiff all over, her eyes rolling up followed by generalized body jerking movements that lasted a minute or so.  There was no bowel bladder incontinence.  No tongue bite.  The patient continued to be altered for about 10 to 15 minutes after this episode and then started to come around.  She was brought into the emergency room for emergent assessment for the seizure-like activity.   ED Course: CT head neg, initial lactate 2.8, repeat 1.4.     Review of Systems: As per HPI otherwise 10 point review of systems negative.   Past Medical History:  Diagnosis Date  . Bipolar disorder (HCC)   . HTN (hypertension)     History reviewed. No pertinent surgical history.   reports that she drank alcohol. She reports that she has current or past drug history. Drug: Marijuana. Her tobacco history is not on file.  No Known Allergies  No family history on file. Non-contributory.  Prior to Admission medications   Medication Sig Start Date End Date Taking? Authorizing Provider  amLODipine (NORVASC) 5 MG tablet Take 5 mg by mouth daily. 04/11/18  Yes  [provider]  DULoxetine (CYMBALTA) 60 MG capsule Take 60 mg by mouth daily. 03/14/18  Yes [provider]  lamoTRIgine (LAMICTAL) 200 MG tablet Take 200 mg by mouth at bedtime. 04/01/18  Yes [provider]  losartan (COZAAR) 100 MG tablet Take 100 mg by mouth daily. 04/11/18  Yes [provider]  rosuvastatin (CRESTOR) 20 MG tablet Take 20 mg by mouth daily. 03/18/18  Yes [provider]    Physical Exam: Vitals:   04/26/18 2245 04/27/18 0015 04/27/18 0100 04/27/18 0145  BP: (!) 161/106 (!) 162/92 (!) 151/89 137/72  Pulse: (!) 117 (!) 102 88   Resp: 20 17 17 20   Temp:      TempSrc:      SpO2: 98% 94% 91%     Constitutional: NAD, calm, comfortable Eyes: PERRL, lids and conjunctivae normal ENMT: Mucous membranes are moist. Posterior pharynx clear of any exudate or lesions.Normal dentition.  Neck: normal, supple, no masses, no thyromegaly Respiratory: clear to auscultation bilaterally, no wheezing, no crackles. Normal respiratory effort. No accessory muscle use.  Cardiovascular: Regular rate and rhythm, no murmurs / rubs / gallops. No extremity edema. 2+ pedal pulses. No carotid bruits.  Abdomen: no tenderness, no masses palpated. No hepatosplenomegaly. Bowel sounds positive.  Musculoskeletal: no clubbing / cyanosis. No joint deformity upper and lower extremities. Good ROM, no contractures. Normal muscle tone.  Skin: no rashes, lesions, ulcers. No induration Neurologic: CN 2-12 grossly intact. Sensation intact, DTR normal.  Strength 5/5 in all 4.  Psychiatric: AAOx3, has tangential thought processes   Labs on Admission: I have personally reviewed following labs and imaging studies  CBC: Recent Labs  Lab 04/26/18 2200  WBC 18.6*  NEUTROABS 14.9*  HGB 14.9  HCT 44.2  MCV 87.0  PLT 294   Basic Metabolic Panel: Recent Labs  Lab 04/26/18 2200  NA 142  K 2.8*  CL 109  CO2 23  GLUCOSE 117*  BUN 13  CREATININE 1.12*  CALCIUM 9.7    MG 2.2   GFR: CrCl cannot be calculated (Unknown ideal weight.). Liver Function Tests: Recent Labs  Lab 04/26/18 2200  AST 36  ALT 34  ALKPHOS 68  BILITOT 0.7  PROT 7.5  ALBUMIN 4.2   No results for input(s): LIPASE, AMYLASE in the last 168 hours. No results for input(s): AMMONIA in the last 168 hours. Coagulation Profile: Recent Labs  Lab 04/26/18 2246  INR 0.93   Cardiac Enzymes: No results for input(s): CKTOTAL, CKMB, CKMBINDEX, TROPONINI in the last 168 hours. BNP (last 3 results) No results for input(s): PROBNP in the last 8760 hours. HbA1C: No results for input(s): HGBA1C in the last 72 hours. CBG: No results for input(s): GLUCAP in the last 168 hours. Lipid Profile: No results for input(s): CHOL, HDL, LDLCALC, TRIG, CHOLHDL, LDLDIRECT in the last 72 hours. Thyroid Function Tests: Recent Labs    04/26/18 2112  TSH 0.757   Anemia Panel: No results for input(s): VITAMINB12, FOLATE, FERRITIN, TIBC, IRON, RETICCTPCT in the last 72 hours. Urine analysis:    Component Value Date/Time   COLORURINE YELLOW 04/26/2018 2236   APPEARANCEUR HAZY (A) 04/26/2018 2236   LABSPEC 1.020 04/26/2018 2236   PHURINE 5.0 04/26/2018 2236   GLUCOSEU NEGATIVE 04/26/2018 2236   HGBUR NEGATIVE 04/26/2018 2236   BILIRUBINUR NEGATIVE 04/26/2018 2236   KETONESUR 5 (A) 04/26/2018 2236   PROTEINUR 30 (A) 04/26/2018 2236   NITRITE NEGATIVE 04/26/2018 2236   LEUKOCYTESUR SMALL (A) 04/26/2018 2236    Radiological Exams on Admission: Dg Chest 2 View  Result Date: 04/27/2018 CLINICAL DATA:  57 year old female with tachycardia. EXAM: CHEST - 2 VIEW COMPARISON:  None. FINDINGS: The heart size and mediastinal contours are within normal limits. Both lungs are clear. The visualized skeletal structures are unremarkable. IMPRESSION: No active cardiopulmonary disease. Electronically Signed   By: Elgie Collard M.D.   On: 04/27/2018 00:02   Ct Head Wo Contrast  Result Date:  04/26/2018 CLINICAL DATA:  57 year old female with seizures. EXAM: CT HEAD WITHOUT CONTRAST TECHNIQUE: Contiguous axial images were obtained from the base of the skull through the vertex without intravenous contrast. COMPARISON:  None. FINDINGS: Brain: The ventricles and sulci appropriate size for patient's age. Minimal periventricular and deep white matter chronic microvascular ischemic changes noted. There is no acute intracranial hemorrhage. No mass effect or midline shift. No extra-axial fluid collection. Vascular: No hyperdense vessel or unexpected calcification. Skull: Normal. Negative for fracture or focal lesion. Sinuses/Orbits: No acute finding.  Right scleral band. Other: None IMPRESSION: No acute intracranial pathology. Electronically Signed   By: Elgie Collard M.D.   On: 04/26/2018 23:55    EKG: Independently reviewed.  Assessment/Plan Principal Problem:   Seizure-like activity (HCC) Active Problems:   Hypertensive urgency   HTN (hypertension)   Bipolar disorder (HCC)    1. Seizure like activity - 1. No PMH of seizures previously. 2. Seizure, possibly HTN urgency or Medication induced; vs syncope of other etiology 3. Syncope pathway 4.  2d echo 5. MRI brain w and w/o contrast 6. EEG 7. Tele monitor 8. Stop tramadol 2. HTN / HTN urgency - 1. Continue amlodipine 2. PRN labetalol 3. BPD - 1. Cont home meds 2. IP psych consult most likely  DVT prophylaxis: Lovenox Code Status: Full Family Communication: Friend at bedside Disposition Plan: Home after admit Consults called: Neurology Admission status: Place in obs   Hillary BowGARDNER, JARED M. DO [facility] Pager [phone] Only works nights!  If 7AM-7PM, please contact the primary day team physician taking care of patient  [email or web address] Password [email or web address]  04/27/2018, 2:01 AM

## 2018-04-27 NOTE — Progress Notes (Signed)
   04/27/18 1600  Clinical Encounter Type  Visited With Patient and family together  Visit Type Initial;Psychological support;Spiritual support  Referral From Nurse  Consult/Referral To Chaplain  Spiritual Encounters  Spiritual Needs Emotional;Other (Comment) (Spiritual Care Conversation/Support)  Stress Factors  Patient Stress Factors Major life changes;Other (Comment) (Spiritual Concerns )  Family Stress Factors None identified   I visited with the patient per Spiritual Care Consult.  The patient stated that she is currently going through an "important time spiritually." The patient stated that she has a spiritual gift of prophecy, and that she feels that she is in the middle of "spiritual warfare" with the devil.  Ms. Brandi Kerr talked about how she needs "prayer warriors" to pray around her, and to "place a circle of protection" around her, so that she can live out her purpose of prophecy.  The patient did not state any specific needs of the Chaplain.  Please, contact Spiritual Care for further assistance.   Chaplain Clint BolderBrittany Kalven Ganim M.Div., [facility]

## 2018-04-27 NOTE — Consult Note (Signed)
[facility] Consult   Reason for Consult: Possibly manic state Referring Physician:  Dr. Nevada Crane Patient Identification: Brandi Kerr MRN:  [id number] Principal Diagnosis: Seizure-like activity [facility]) Diagnosis:   Patient Active Problem List   Diagnosis Date Noted  . Hypertensive urgency [I16.0] 04/27/2018  . HTN (hypertension) [I10] 04/27/2018  . Bipolar disorder ([place]) [F31.9] 04/27/2018  . Seizure-like activity ([place]) [R56.9] 04/27/2018    Total Time spent with patient: 1 hour  Subjective:   Brandi Kerr is a 57 y.o. female patient admitted with seizure like activity.  HPI:   Per chart review, patient was admitted with seizure like activity that was witnessed by a friend while eating dinner at her friend's home. She was hypertensive with SBP to 180s and lactic acid of 2.86 on admission. UDS was positive for THC and BAL was negative. She has a history of bipolar disorder and was recently hospitalized and discharged in [place] for mania. Home medications include Cymbalta 60 mg daily and Lamictal 200 mg qhs. She is compliant with her medications. She received Ativan 1 mg overnight and 2 mg this morning.   On interview, Brandi Kerr reports that she was diagnosed with bipolar disorder in 1976.  She reports that she was recently discharged from a psychiatric hospital in [place].  She was treated for mania.  She reports that she was not sleeping for 3 days due to multiple stressors including having a UTI.  She reports that she is back at her baseline although when she first arrived in [place] to visit her friend she still felt hypomanic.  She reports no problems with her sleep or appetite at this time.  She denies SI, HI or AVH.  She denies a history of seizures.  Home medications include Cymbalta and Lamictal.  She has been taking Cymbalta for about a year and Lamictal for several years.  She reports compliance with her medications.  Past Psychiatric History: Bipolar disorder and a  history of suicide attempt 10 years ago by overdose in the setting of financial stressors.   Risk to Self:  None. Denies SI. Risk to Others:  None. Denies HI. Prior Inpatient Therapy:  She has been hospitalized multiple times for bipolar disorder.  Prior Outpatient Therapy:  She is followed by a provider in [place].   Past Medical History:  Past Medical History:  Diagnosis Date  . Bipolar disorder ([place])   . HTN (hypertension)    History reviewed. No pertinent surgical history. Family History: History reviewed. No pertinent family history. Family Psychiatric  History:Unknown  Social History:  Social History   Substance and Sexual Activity  Alcohol Use Not Currently  . Frequency: Never     Social History   Substance and Sexual Activity  Drug Use Not Currently  . Types: Marijuana    Social History   Socioeconomic History  . Marital status: Married    Spouse name: Not on file  . Number of children: Not on file  . Years of education: Not on file  . Highest education level: Not on file  Occupational History  . Not on file  Social Needs  . Financial resource strain: Not on file  . Food insecurity:    Worry: Not on file    Inability: Not on file  . Transportation needs:    Medical: Not on file    Non-medical: Not on file  Tobacco Use  . Smoking status: Never Smoker  . Smokeless tobacco: Never Used  Substance and Sexual Activity  .  Alcohol use: Not Currently    Frequency: Never  . Drug use: Not Currently    Types: Marijuana  . Sexual activity: Not on file  Lifestyle  . Physical activity:    Days per week: Not on file    Minutes per session: Not on file  . Stress: Not on file  Relationships  . Social connections:    Talks on phone: Not on file    Gets together: Not on file    Attends religious service: Not on file    Active member of club or organization: Not on file    Attends meetings of clubs or organizations: Not on file    Relationship status: Not on file   Other Topics Concern  . Not on file  Social History Narrative  . Not on file   Additional Social History: She lives in [place] with her husband of 12 years. She has 3 adult children. She is unemployed. She receives disability. She denies alcohol or illicit substance use although UDS was positive for marijuana.     Allergies:  No Known Allergies  Labs:  Results for orders placed or performed during the hospital encounter of 04/26/18 (from the past 48 hour(s))  Ethanol     Status: None   Collection Time: 04/26/18  9:12 PM  Result Value Ref Range   Alcohol, Ethyl (B) <10 <10 mg/dL    Comment: (NOTE) Lowest detectable limit for serum alcohol is 10 mg/dL. For medical purposes only. Performed at [facility], [place] [address] [place], [place] [address]   TSH     Status: None   Collection Time: 04/26/18  9:12 PM  Result Value Ref Range   TSH 0.757 0.350 - 4.500 uIU/mL    Comment: Performed by a 3rd Generation assay with a functional sensitivity of <=0.01 uIU/mL. Performed at [facility], [place] [address] [place], [place] [address]   CBC with Differential     Status: Abnormal   Collection Time: 04/26/18 10:00 PM  Result Value Ref Range   WBC 18.6 (H) 4.0 - 10.5 K/uL   RBC 5.08 3.87 - 5.11 MIL/uL   Hemoglobin 14.9 12.0 - 15.0 g/dL   HCT 44.2 36.0 - 46.0 %   MCV 87.0 78.0 - 100.0 fL   MCH 29.3 26.0 - 34.0 pg   MCHC 33.7 30.0 - 36.0 g/dL   RDW 13.6 11.5 - 15.5 %   Platelets 294 150 - 400 K/uL   Neutrophils Relative % 80 %   Neutro Abs 14.9 (H) 1.7 - 7.7 K/uL   Lymphocytes Relative 13 %   Lymphs Abs 2.3 0.7 - 4.0 K/uL   Monocytes Relative 7 %   Monocytes Absolute 1.3 (H) 0.1 - 1.0 K/uL   Eosinophils Relative 0 %   Eosinophils Absolute 0.0 0.0 - 0.7 K/uL   Basophils Relative 0 %   Basophils Absolute 0.0 0.0 - 0.1 K/uL    Comment: Performed at [facility], [place] [address], [place], [place] [address]  Comprehensive  metabolic panel     Status: Abnormal   Collection Time: 04/26/18 10:00 PM  Result Value Ref Range   Sodium 142 135 - 145 mmol/L   Potassium 2.8 (L) 3.5 - 5.1 mmol/L   Chloride 109 98 - 111 mmol/L    Comment: Please note change in reference range.   CO2 23 22 - 32 mmol/L   Glucose, Bld 117 (H) 70 - 99 mg/dL    Comment: Please  note change in reference range.   BUN 13 6 - 20 mg/dL    Comment: Please note change in reference range.   Creatinine, Ser 1.12 (H) 0.44 - 1.00 mg/dL   Calcium 9.7 8.9 - 10.3 mg/dL   Total Protein 7.5 6.5 - 8.1 g/dL   Albumin 4.2 3.5 - 5.0 g/dL   AST 36 15 - 41 U/L   ALT 34 0 - 44 U/L    Comment: Please note change in reference range.   Alkaline Phosphatase 68 38 - 126 U/L   Total Bilirubin 0.7 0.3 - 1.2 mg/dL   GFR calc non Af Amer 53 (L) >60 mL/min   GFR calc Af Amer >60 >60 mL/min    Comment: (NOTE) The eGFR has been calculated using the CKD EPI equation. This calculation has not been validated in all clinical situations. eGFR's persistently <60 mL/min signify possible Chronic Kidney Disease.    Anion gap 10 5 - 15    Comment: Performed at [facility], [place] [address], [place], [place] [address]  Magnesium     Status: None   Collection Time: 04/26/18 10:00 PM  Result Value Ref Range   Magnesium 2.2 1.7 - 2.4 mg/dL    Comment: Performed at [facility], [place] [address] [place], [place] [address]  I-Stat Troponin, ED (not at [facility])     Status: None   Collection Time: 04/26/18 10:10 PM  Result Value Ref Range   Troponin i, poc 0.02 0.00 - 0.08 ng/mL   Comment 3            Comment: Due to the release kinetics of cTnI, a negative result within the first hours of the onset of symptoms does not rule out myocardial infarction with certainty. If myocardial infarction is still suspected, repeat the test at appropriate intervals.   I-Stat CG4 Lactic Acid, ED     Status: Abnormal   Collection Time: 04/26/18 10:11 PM   Result Value Ref Range   Lactic Acid, Venous 2.86 (HH) 0.5 - 1.9 mmol/L   Comment NOTIFIED PHYSICIAN   Rapid urine drug screen (hospital performed)     Status: Abnormal   Collection Time: 04/26/18 10:36 PM  Result Value Ref Range   Opiates NONE DETECTED NONE DETECTED   Cocaine NONE DETECTED NONE DETECTED   Benzodiazepines NONE DETECTED NONE DETECTED   Amphetamines NONE DETECTED NONE DETECTED   Tetrahydrocannabinol POSITIVE (A) NONE DETECTED   Barbiturates (A) NONE DETECTED    Result not available. Reagent lot number recalled by manufacturer.    Comment: Performed at [facility], [place] [address], [place], [place] [address]  Urinalysis, Routine w reflex microscopic     Status: Abnormal   Collection Time: 04/26/18 10:36 PM  Result Value Ref Range   Color, Urine YELLOW YELLOW   APPearance HAZY (A) CLEAR   Specific Gravity, Urine 1.020 1.005 - 1.030   pH 5.0 5.0 - 8.0   Glucose, UA NEGATIVE NEGATIVE mg/dL   Hgb urine dipstick NEGATIVE NEGATIVE   Bilirubin Urine NEGATIVE NEGATIVE   Ketones, ur 5 (A) NEGATIVE mg/dL   Protein, ur 30 (A) NEGATIVE mg/dL   Nitrite NEGATIVE NEGATIVE   Leukocytes, UA SMALL (A) NEGATIVE   RBC / HPF 0-5 0 - 5 RBC/hpf   WBC, UA 11-20 0 - 5 WBC/hpf   Bacteria, UA RARE (A) NONE SEEN   Squamous Epithelial / LPF 6-10 0 - 5   Mucus PRESENT    Hyaline  Casts, UA PRESENT     Comment: Performed at [facility], [place] [address], [place], [place] [address]  Protime-INR     Status: None   Collection Time: 04/26/18 10:46 PM  Result Value Ref Range   Prothrombin Time 12.3 11.4 - 15.2 seconds   INR 0.93     Comment: Performed at [facility], [place] [address], [place], [place] [address]  I-Stat CG4 Lactic Acid, ED     Status: None   Collection Time: 04/26/18 11:41 PM  Result Value Ref Range   Lactic Acid, Venous 1.46 0.5 - 1.9 mmol/L  Glucose, capillary     Status: None   Collection Time: 04/27/18  7:28 AM   Result Value Ref Range   Glucose-Capillary 92 70 - 99 mg/dL    Current Facility-Administered Medications  Medication Dose Route Frequency Provider Last Rate Last Dose  . amLODipine (NORVASC) tablet 5 mg  5 mg Oral Daily Jennette Kettle M, DO      . DULoxetine (CYMBALTA) DR capsule 60 mg  60 mg Oral Daily Alcario Drought, Jared M, DO      . enoxaparin (LOVENOX) injection 40 mg  40 mg Subcutaneous Q24H Alcario Drought, Jared M, DO      . labetalol (NORMODYNE,TRANDATE) injection 10 mg  10 mg Intravenous Q2H PRN Etta Quill, DO      . lamoTRIgine (LAMICTAL) tablet 200 mg  200 mg Oral QHS Jennette Kettle M, DO   200 mg at 04/27/18 0308  . losartan (COZAAR) tablet 100 mg  100 mg Oral Daily Alcario Drought, Jared M, DO      . rosuvastatin (CRESTOR) tablet 20 mg  20 mg Oral Daily Alcario Drought, Jared M, DO      . sodium chloride flush (NS) 0.9 % injection 3 mL  3 mL Intravenous Q12H Jennette Kettle M, DO   3 mL at 04/27/18 0310    Musculoskeletal: Strength & Muscle Tone: within normal limits Gait & Station: UTA since patient is lying in bed. Patient leans: N/A  Psychiatric Specialty Exam: Physical Exam  Nursing note and vitals reviewed. Constitutional: She is oriented to person, place, and time. She appears well-developed and well-nourished.  HENT:  Head: Normocephalic and atraumatic.  Neck: Normal range of motion.  Respiratory: Effort normal.  Musculoskeletal: Normal range of motion.  Neurological: She is alert and oriented to person, place, and time.  Skin: No rash noted.  Psychiatric: She has a normal mood and affect. Her speech is normal and behavior is normal. Judgment and thought content normal. Cognition and memory are normal.    Review of Systems  Constitutional: Negative for chills and fever.  Cardiovascular: Negative for chest pain.  Gastrointestinal: Negative for abdominal pain, constipation, diarrhea, nausea and vomiting.  Psychiatric/Behavioral: Negative for depression, hallucinations, substance  abuse and suicidal ideas. The patient does not have insomnia.   All other systems reviewed and are negative.   Blood pressure (!) 152/93, pulse 98, temperature 98.3 F (36.8 C), temperature source Oral, resp. rate 18, height _0  (1.702 m), weight 106 kg (233 lb 11.2 oz), SpO2 95 %.Body mass index is 36.6 kg/m.  General Appearance: Fairly Groomed, middle aged, Caucasian female, wearing a hospital gown with short haircut who is lying in bed. NAD.   Eye Contact:  Good  Speech:  Clear and Coherent and Normal Rate  Volume:  Normal  Mood:  Euthymic  Affect:  Appropriate and Congruent  Thought Process:  Goal Directed, Linear and Descriptions of Associations:  Intact  Orientation:  Full (Time, Place, and Person)  Thought Content:  Logical  Suicidal Thoughts:  No  Homicidal Thoughts:  No  Memory:  Immediate;   Good Recent;   Good Remote;   Good  Judgement:  Good  Insight:  Good  Psychomotor Activity:  Normal  Concentration:  Concentration: Good and Attention Span: Good  Recall:  Good  Fund of Knowledge:  Good  Language:  Good  Akathisia:  No  Handed:  Right  AIMS (if indicated):   N/A  Assets:  Communication Skills Desire for Improvement Financial Resources/Insurance Housing Intimacy Social Support  ADL's:  Intact  Cognition:  WNL  Sleep:   Okay   Assessment: Brandi Kerr is a 57 y.o. female who was admitted with seizure like activity witnessed by a friend. Psychiatry was consulted for mania. Patient does not exhibit manic symptoms. She is appropriate in behavior and thoughts process. Her speech is not pressured. She denies SI, HI or psychosis and does not appear to be responding to internal stimuli. She denies problems with sleep. She does not warrant inpatient psychiatric hospitalization at this time.   Treatment Plan Summary: -Continue Lamictal 200 mg qhs for mood stabilization and Cymbalta 60 mg daily for depression. -Patient should continue to follow up with her outpatient  provider for medication management.  -Psychiatry will sign off on patient at this time. Please consult psychiatry again as needed.  Disposition: No evidence of imminent risk to self or others at present.   Patient does not meet criteria for psychiatric inpatient admission.  Faythe Dingwall, DO 04/27/2018 9:41 AM

## 2018-04-27 NOTE — Progress Notes (Signed)
  Echocardiogram 2D Echocardiogram has been performed.  Brandi Kerr 04/27/2018, 10:41 AM

## 2018-04-28 ENCOUNTER — Observation Stay (HOSPITAL_COMMUNITY)
Admit: 2018-04-28 | Discharge: 2018-04-28 | Disposition: A | Discharge status: Home / Self Care | Payer: Medicare HMO | Attending: Internal Medicine | Admitting: Internal Medicine

## 2018-04-28 DIAGNOSIS — E876 Hypokalemia: Secondary | ICD-10-CM

## 2018-04-28 DIAGNOSIS — F3173 Bipolar disorder, in partial remission, most recent episode manic: Secondary | ICD-10-CM | POA: Diagnosis not present

## 2018-04-28 DIAGNOSIS — I10 Essential (primary) hypertension: Secondary | ICD-10-CM | POA: Diagnosis not present

## 2018-04-28 DIAGNOSIS — F3010 Manic episode without psychotic symptoms, unspecified: Secondary | ICD-10-CM | POA: Diagnosis not present

## 2018-04-28 DIAGNOSIS — H539 Unspecified visual disturbance: Secondary | ICD-10-CM

## 2018-04-28 DIAGNOSIS — I160 Hypertensive urgency: Secondary | ICD-10-CM | POA: Diagnosis not present

## 2018-04-28 DIAGNOSIS — R569 Unspecified convulsions: Secondary | ICD-10-CM | POA: Diagnosis not present

## 2018-04-28 LAB — BASIC METABOLIC PANEL
ANION GAP: 9 (ref 5–15)
BUN: 7 mg/dL (ref 6–20)
CALCIUM: 8.7 mg/dL — AB (ref 8.9–10.3)
CHLORIDE: 107 mmol/L (ref 98–111)
CO2: 25 mmol/L (ref 22–32)
Creatinine, Ser: 0.72 mg/dL (ref 0.44–1.00)
GFR calc non Af Amer: >60 mL/min (ref >60)
GLUCOSE: 97 mg/dL (ref 70–99)
POTASSIUM: 3.3 mmol/L — AB (ref 3.5–5.1)
Sodium: 141 mmol/L (ref 135–145)

## 2018-04-28 LAB — GLUCOSE, CAPILLARY: GLUCOSE-CAPILLARY: 88 mg/dL (ref 70–99)

## 2018-04-28 LAB — CBC
HCT: 38.5 % (ref 36.0–46.0)
HEMOGLOBIN: 12.5 g/dL (ref 12.0–15.0)
MCH: 28.6 pg (ref 26.0–34.0)
MCHC: 32.5 g/dL (ref 30.0–36.0)
MCV: 88.1 fL (ref 78.0–100.0)
Platelets: 221 10*3/uL (ref 150–400)
RBC: 4.37 MIL/uL (ref 3.87–5.11)
RDW: 13.7 % (ref 11.5–15.5)
WBC: 7.1 10*3/uL (ref 4.0–10.5)

## 2018-04-28 LAB — HIV ANTIBODY (ROUTINE TESTING W REFLEX): HIV SCREEN 4TH GENERATION: NONREACTIVE

## 2018-04-28 LAB — URINE CULTURE

## 2018-04-28 MED ORDER — POTASSIUM CHLORIDE CRYS ER 20 MEQ PO TBCR
40.0000 meq | EXTENDED_RELEASE_TABLET | Freq: Once | ORAL | Status: AC
  Administered 2018-04-28: 40 meq via ORAL
  Filled 2018-04-28: qty 2

## 2018-04-28 NOTE — Progress Notes (Signed)
Pt d/c to care and car of friend. Pt PIV removed withotu cmolication. VSS. Discharge paperwork, reasons to return to ED/MD, and after visit care summary reviewed with pt. Pt confirmed understanding with teachback method.

## 2018-04-28 NOTE — Discharge Instructions (Signed)
Living With Bipolar Disorder If you have been diagnosed with bipolar disorder, you may be relieved that you now know why you have felt or behaved a certain way. You may also feel overwhelmed about the treatment ahead, how to get the support you need, and how to deal with the condition day-to-day. With care and support, you can learn to manage your symptoms and live with bipolar disorder. How to manage lifestyle changes Managing stress Stress is your body's reaction to life changes and events, both good and bad. Stress can play a major role in bipolar disorder, so it is important to learn how to cope with stress. Some techniques to cope with stress include:  Meditation, muscle relaxation, and breathing exercises.  Exercise. Even a short daily walk can help to lower stress levels.  Getting enough good-quality sleep. Too little sleep can cause mania to start (can trigger mania).  Making a schedule to manage your time. Knowing your daily schedule can help to keep you from feeling overwhelmed by tasks and deadlines.  Spending time on hobbies that you enjoy.  Medicines Your health care provider may suggest certain medicines if he or she feels that they will help improve your condition. Avoid using caffeine, alcohol, and other substances that may prevent your medicines from working properly (may interact). It is also important to:  Talk with your pharmacist or health care provider about all the medicines that you take, their possible side effects, and which medicines are safe to take together.  Make it your goal to take part in all treatment decisions (shared decision-making). Ask about possible side effects of medicines that your health care provider recommends, and tell him or her how you feel about having those side effects. It is best if shared decision-making with your health care provider is part of your total treatment plan.  If you are taking medicines as part of your treatment, do not stop  taking medicines before you ask your health care provider if it is safe to stop. You may need to have the medicine slowly decreased (tapered) over time to decrease the risk of harmful side effects. Relationships Spend time with people that you trust and with whom you feel a sense of understanding and calm. Try to find friends or family members who make you feel safe and can help you control feelings of mania. Consider going to couples counseling, family education classes, or family therapy to:  Educate your loved ones about your condition and offer suggestions about how they can support you.  Help resolve conflicts.  Help develop communication skills in your relationships.  How to recognize changes in your condition Everyone responds differently to treatment for bipolar disorder. Some signs that your condition is improving include:  Leveling of your mood. You may have less anger and excitement about daily activities, and your low moods may not be as bad.  Your symptoms being less intense.  Feeling calm more often.  Thinking clearly.  Not experiencing consequences for extreme behavior.  Feeling like your life is settling down.  Your behavior seeming more normal to you and to other people.  Some signs that your condition may be getting worse include:  Sleep problems.  Moods cycling between deep lows and unusually high (excess) energy.  Extreme emotions.  More anger at loved ones.  Staying away from others (isolating yourself).  A feeling of power or superiority.  Completing a lot of tasks in a very short amount of time.  Unusual thoughts and behaviors.    Suicidal thoughts.  Where to find support Talking to others  Try making a list of the people you may want to tell about your condition, such as the people you trust most.  Plan what you are willing to talk about and what you do not want to discuss. Think about your needs ahead of time, and how your friends and family  members can support you.  Let your loved ones know when they can share advice and when you would just like them to listen.  Give your loved ones information about bipolar disorder, and encourage them to learn about the condition. Finances Not all insurance plans cover mental health care, so it is important to check with your insurance carrier. If paying for co-pays or counseling services is a problem, search for a local or county mental health care center. Public mental health care services may be offered there at a low cost or no cost when you are not able to see a private health care provider. If you are taking medicine for depression, you may be able to get the generic form, which may be less expensive than brand-name medicine. Some makers of prescription medicines also offer help to patients who cannot afford the medicines they need. Follow these instructions at home: Medicines  Take over-the-counter and prescription medicines only as told by your health care provider or pharmacist.  Ask your pharmacist what over-the-counter cold medicines you should avoid. Some medicines can make symptoms worse. General instructions  Ask for support from trusted family members or friends to make sure you stay on track with your treatment.  Keep a journal to write down your daily moods, medicines, sleep habits, and life events. This may help you have more success with your treatment.  Make and follow a routine for daily meal times. Eat healthy foods, such as whole grains, vegetables, and fresh fruit.  Try to go to sleep and wake up around the same time every day.  Keep all follow-up visits as told by your health care provider. This is important. Questions to ask your health care provider:  If you are taking medicines: ? How long do I need to take medicine? ? Are there any long-term side effects of my medicine? ? Are there any alternatives to taking medicine?  How would I benefit from  therapy?  How often should I follow up with a health care provider? Contact a health care provider if:  Your symptoms get worse or they do not get better with treatment. Get help right away if:  You have thoughts about harming yourself or others. If you ever feel like you may hurt yourself or others, or have thoughts about taking your own life, get help right away. You can go to your nearest emergency department or call:  Your local emergency services (911 in the [place]  A suicide crisis helpline, such as the National Suicide Prevention Lifeline at 1-800-273-8255. This is open 24-hours a day.  Summary  Learning ways to deal with stress can help to calm you and may also help your treatment work better.  There is a wide range of medicines that can help to treat bipolar disorder.  Having healthy relationships can help to make your moods more stable.  Contact a health care provider if your symptoms get worse or they do not get better with treatment. This information is not intended to replace advice given to you by your health care provider. Make sure you discuss any questions you have with your   health care provider. Document Released: 02/17/2017 Document Revised: 02/17/2017 Document Reviewed: 02/17/2017 Elsevier Interactive Patient Education  2018 [employer]   Seizure, Adult When you have a seizure:  Parts of your body may move.  How aware or awake (conscious) you are may change.  You may shake (convulse).  Some people have symptoms right before a seizure happens. These symptoms may include:  Fear.  Worry (anxiety).  Feeling like you are going to throw up (nausea).  Feeling like the room is spinning (vertigo).  Feeling like you saw or heard something before (deja vu).  Odd tastes or smells.  Changes in vision, such as seeing flashing lights or spots.  Seizures usually last from 30 seconds to 2 minutes. Usually, they are not harmful unless they last a long  time. Follow these instructions at home: Medicines  Take over-the-counter and prescription medicines only as told by your doctor.  Avoid anything that may keep your medicine from working, such as alcohol. Activity  Do not do any activities that would be dangerous if you had another seizure, like driving or swimming. Wait until your doctor approves.  If you live in the [place] ask your local DMV (department of motor vehicles) when you can drive.  Rest. Teaching others  Teach friends and family what to do when you have a seizure. They should: ? Lay you on the ground. ? Protect your head and body. ? Loosen any tight clothing around your neck. ? Turn you on your side. ? Stay with you until you are better. ? Not hold you down. ? Not put anything in your mouth. ? Know whether or not you need emergency care. General instructions  Contact your doctor each time you have a seizure.  Avoid anything that gives you seizures.  Keep a seizure diary. Write down: ? What you think caused each seizure. ? What you remember about each seizure.  Keep all follow-up visits as told by your doctor. This is important. Contact a doctor if:  You have another seizure.  You have seizures more often.  There is any change in what happens during your seizures.  You continue to have seizures with treatment.  You have symptoms of being sick or having an infection. Get help right away if:  You have a seizure: ? That lasts longer than 5 minutes. ? That is different than seizures you had before. ? That makes it harder to breathe. ? After you hurt your head.  After a seizure, you cannot speak or use a part of your body.  After a seizure, you are confused or have a bad headache.  You have two or more seizures in a row.  You are having seizures more often.  You do not wake up right after a seizure.  You get hurt during a seizure. In an emergency:  These symptoms may be an emergency. Do not  wait to see if the symptoms will go away. Get medical help right away. Call your local emergency services (911 in the [place] Do not drive yourself to the hospital. This information is not intended to replace advice given to you by your health care provider. Make sure you discuss any questions you have with your health care provider. Document Released: 04/05/2008 Document Revised: 06/30/2016 Document Reviewed: 06/30/2016 Elsevier Interactive Patient Education  2017 [employer]

## 2018-04-28 NOTE — Progress Notes (Deleted)
Subjective: Patient has no complaints at this time.  No further seizures.  She is very pleasant at this time.  I have explained to her that the EEG will be done today.  Had multiple questions about Tegretol and Cymbalta.  She is willing to get off his Cymbalta if needed.  Exam: Vitals:   04/28/18 0205 04/28/18 0547  BP: (!) 142/86 (!) 149/97  Pulse: 64 77  Resp: 18 18  Temp: 98.9 F (37.2 C) 98.4 F (36.9 C)  SpO2: 97% 99%    Physical Exam   HEENT-  Normocephalic, no lesions, without obvious abnormality.  Normal external eye and conjunctiva.   Extremities- Warm, dry and intact Musculoskeletal-no joint tenderness, deformity or swelling Skin-warm and dry, no hyperpigmentation, vitiligo, or suspicious lesions    Neuro:  Mental Status: Alert, oriented, thought content appropriate.  Speech fluent without evidence of aphasia.  Able to follow 3 step commands without difficulty. Cranial Nerves: II: Discs flat bilaterally; Visual fields grossly normal,  III,IV, VI: ptosis not present, extra-ocular motions intact bilaterally pupils equal, round, reactive to light and accommodation V,VII: smile symmetric, facial light touch sensation normal bilaterally VIII: hearing normal bilaterally IX,X: uvula rises symmetrically XI: bilateral shoulder shrug XII: midline tongue extension Motor: Right : Upper extremity   5/5    Left:     Upper extremity   5/5  Lower extremity   5/5     Lower extremity   5/5 Mild tremor with hands held outstretched Sensory: Pinprick and light touch intact throughout, bilaterally Deep Tendon Reflexes: 2+ and symmetric throughout Plantars: Right: downgoing   Left: downgoing Cerebellar: normal finger-to-nose, normal rapid alternating movements and normal heel-to-shin test Gait: normal gait and station    Medications:  Scheduled: . amLODipine  5 mg Oral Daily  . DULoxetine  60 mg Oral Daily  . enoxaparin (LOVENOX) injection  40 mg Subcutaneous Q24H  .  lamoTRIgine  200 mg Oral QHS  . losartan  100 mg Oral Daily  . rosuvastatin  20 mg Oral Daily  . sodium chloride flush  3 mL Intravenous Q12H    Pertinent Labs/Diagnostics: EEG pending  Dg Chest 2 View  Result Date: 04/27/2018 CLINICAL DATA:  57 year old female with tachycardia. EXAM: CHEST - 2 VIEW COMPARISON:  None. FINDINGS: The heart size and mediastinal contours are within normal limits. Both lungs are clear. The visualized skeletal structures are unremarkable. IMPRESSION: No active cardiopulmonary disease. Electronically Signed   By: Elgie CollardArash  Radparvar M.D.   On: 04/27/2018 00:02   Ct Head Wo Contrast  Result Date: 04/26/2018 CLINICAL DATA:  57 year old female with seizures. EXAM: CT HEAD WITHOUT CONTRAST TECHNIQUE: Contiguous axial images were obtained from the base of the skull through the vertex without intravenous contrast. COMPARISON:  None. FINDINGS: Brain: The ventricles and sulci appropriate size for patient's age. Minimal periventricular and deep white matter chronic microvascular ischemic changes noted. There is no acute intracranial hemorrhage. No mass effect or midline shift. No extra-axial fluid collection. Vascular: No hyperdense vessel or unexpected calcification. Skull: Normal. Negative for fracture or focal lesion. Sinuses/Orbits: No acute finding.  Right scleral band. Other: None IMPRESSION: No acute intracranial pathology. Electronically Signed   By: Elgie CollardArash  Radparvar M.D.   On: 04/26/2018 23:55   Mr Laqueta JeanBrain W EAWo Contrast  Result Date: 04/27/2018 CLINICAL DATA:  Seizure like activity. EXAM: MRI HEAD WITHOUT AND WITH CONTRAST TECHNIQUE: Multiplanar, multiecho pulse sequences of the brain and surrounding structures were obtained without and with intravenous contrast. CONTRAST:  20mL  MULTIHANCE GADOBENATE DIMEGLUMINE 529 MG/ML IV SOLN COMPARISON:  Head CT 04/26/2018 FINDINGS: The study is moderately motion degraded. Brain: There is no evidence of acute infarct, intracranial  hemorrhage, mass, midline shift, or extra-axial fluid collection. The ventricles and sulci are normal. The hippocampi are grossly symmetric in size and signal, however detailed assessment is precluded by the degree of motion artifact. No significant brain parenchymal signal abnormality is identified. No abnormal enhancement is identified. Vascular: Major intracranial vascular flow voids are preserved. Skull and upper cervical spine: Unremarkable bone marrow signal. Sinuses/Orbits: Right scleral buckle. Paranasal sinuses and mastoid air cells are clear. Other: None. IMPRESSION: Negative brain MRI within limitations of motion. Electronically Signed   By: Sebastian Ache M.D.   On: 04/27/2018 09:42        Assessment: 57 year old female with new onset seizure however some of the history also points towards possible non-organic cause.    Recommendations: -As noted would consider alternative her Cymbalta -EEG -Follow-up with PCP as an outpatient and I have also explained to her she may want to consider following up with her psychologist considering her psych medications and Cymbalta -Per [facility] statutes, patients with seizures are not allowed to drive until  they have been seizure-free for six months. Use caution when using heavy equipment or power tools. Avoid working on ladders or at heights. Take showers instead of baths. Ensure the water temperature is not too high on the home water heater. Do not go swimming alone. When caring for infants or small children, sit down when holding, feeding, or changing them to minimize risk of injury to the child in the event you have a seizure.   Also, Maintain good sleep hygiene. Avoid alcohol.    Felicie Morn PA-C [facility] [phone]   04/28/2018, 10:27 AM

## 2018-04-28 NOTE — Progress Notes (Signed)
EEG complete - results pending 

## 2018-04-28 NOTE — Procedures (Signed)
History: 57 year old female being evaluated for seizures  Sedation: None  Technique: This is a 21 channel routine scalp EEG performed at the bedside with bipolar and monopolar montages arranged in accordance to the international 10/20 system of electrode placement. One channel was dedicated to EKG recording.    Background: The background consists of intermixed alpha and beta activities. There is a well defined posterior dominant rhythm of 9 Hz that attenuates with eye opening. Sleep is not recorded.  Photic stimulation: Physiologic driving is not performed  EEG Abnormalities: None  Clinical Interpretation: This normal EEG is recorded in the waking and drowsy state. There was no seizure or seizure predisposition recorded on this study. Please note that a normal EEG does not preclude the possibility of epilepsy.   Brandi SlotMcNeill Waleska Buttery, MD [facility] [phone]  If 7pm- 7am, please page neurology on call as listed in [facility].

## 2018-04-28 NOTE — Discharge Summary (Signed)
Discharge Summary  Brandi Kerr ZOX:[id number] DOB: [birth year]  PCP: Patient, No Pcp Per  Admit date: 04/26/2018 Discharge date: 04/28/2018  Time spent: 25 minutes  Recommendations for Outpatient Follow-up:  1. Follow-up with your PCP-patient is visiting here in [place] state 2. Follow-up with psychologist-patient is visiting here in [place] from [place] 3. Take your medications as prescribed 4. Do not drive or operate heavy equipments for at least 6 months until recommended by a neurologist or PCP.  Neurology recommendations: Follow-up with PCP as an outpatient and I have also explained to her she may want to consider following up with her psychologist considering her psych medications and Cymbalta  -Per [facility] [place] statutes, patients with seizures are not allowed to drive until  they have been seizure-free for six months. Use caution when using heavy equipment or power tools. Avoid working on ladders or at heights. Take showers instead of baths. Ensure the water temperature is not too high on the home water heater. Do not go swimming alone. When caring for infants or small children, sit down when holding, feeding, or changing them to minimize risk of injury to the child in the event you have a seizure.   Also, Maintain good sleep hygiene. Avoid alcohol.    Discharge Diagnoses:  Active Hospital Problems   Diagnosis Date Noted  . Bipolar disorder (HCC) 04/27/2018  . Hypertensive urgency 04/27/2018  . HTN (hypertension) 04/27/2018  . Seizure-like activity (HCC) 04/27/2018    Resolved Hospital Problems  No resolved problems to display.    Discharge Condition: Stable  Diet recommendation: Resume previous diet  Vitals:   04/28/18 0205 04/28/18 0547  BP: (!) 142/86 (!) 149/97  Pulse: 64 77  Resp: 18 18  Temp: 98.9 F (37.2 C) 98.4 F (36.9 C)  SpO2: 97% 99%    History of present illness:  Brandi HoopsLynn Ungeris a 57 y.o.femalewith  medical history significant ofbipolar disorder currently experiencing manic symptoms for which she was [place] in [place] and discharged yesterday, HTN, HLD. [facility] of seizure like event.  Manic episode has resolved and is cleared by psych.  Highly appreciate recommendations.  Continue current medications.  MRI brain unremarkable for any acute intracranial findings.  No recurrent seizures in the hospital.  Alert and oriented x3.  04/28/2018: Patient seen and examined at her bedside she has no new complaints.  On the day of discharge patient was hemodynamically stable.  She will need to follow-up with her primary care provider and psychologist posthospitalization.  She will need to not drive or operate any heavy machinery as stated above.  Patient understands and agrees to plan.    Hospital Course:  Principal Problem:   Bipolar disorder [facility](HCC) Active Problems:   Hypertensive urgency   HTN (hypertension)   Seizure-like activity (HCC)  1. Seizure like activity - 1. No PMH of seizures previously. 2. Seizure, possibly HTN urgency or Medication induced; vs syncope of other etiology 3. Syncope pathway 4. 2d echo unremarkable 5. MRI brain w and w/o contrast unremarkable for any acute intracranial abnormalities 6. EEG pending 7. Tele monitor 8. Stop tramadol 2. HTN / HTN urgency -stable 1. Continue amlodipine 3. BPD - 1. Cont home meds 2. Psych recommends continuing Lamictal 200 mg nightly for mood stabilization and Cymbalta 6 mg daily for depression/fibromyalgia     Procedures:  None  Consultations: Neurology  Discharge Exam: BP (!) 149/97 (BP Location: Left Arm)   Pulse  77   Temp 98.4 F (36.9 C) (Oral)   Resp 18   Ht 5\' 7"  (1.702 m)   Wt 106 kg (233 lb 11.2 oz)   SpO2 99%   BMI 36.60 kg/m  . General: 57 y.o. year-old female well developed well nourished in no acute distress.  Alert and oriented x3. . Cardiovascular:  Regular rate and rhythm with no rubs or gallops.  No thyromegaly or JVD noted.   Marland Kitchen Respiratory: Clear to auscultation with no wheezes or rales. Good inspiratory effort. . Abdomen: Soft nontender nondistended with normal bowel sounds x4 quadrants. . Musculoskeletal: No lower extremity edema. 2/4 pulses in all 4 extremities. . Skin: No ulcerative lesions noted or rashes, . Psychiatry: Mood is appropriate for condition and setting  Discharge Instructions You were cared for by a hospitalist during your hospital stay. If you have any questions about your discharge medications or the care you received while you were in the hospital after you are discharged, you can call the unit and asked to speak with the hospitalist on call if the hospitalist that took care of you is not available. Once you are discharged, your primary care physician will handle any further medical issues. Please note that NO REFILLS for any discharge medications will be authorized once you are discharged, as it is imperative that you return to your primary care physician (or establish a relationship with a primary care physician if you do not have one) for your aftercare needs so that they can reassess your need for medications and monitor your lab values.   Allergies as of 04/28/2018   No Known Allergies     Medication List    TAKE these medications   amLODipine 5 MG tablet Commonly known as:  NORVASC Take 5 mg by mouth daily.   DULoxetine 60 MG capsule Commonly known as:  CYMBALTA Take 60 mg by mouth daily.   lamoTRIgine 200 MG tablet Commonly known as:  LAMICTAL Take 200 mg by mouth at bedtime.   losartan 100 MG tablet Commonly known as:  COZAAR Take 100 mg by mouth daily.   rosuvastatin 20 MG tablet Commonly known as:  CRESTOR Take 20 mg by mouth daily.      No Known Allergies    The results of significant diagnostics from this hospitalization (including imaging, microbiology, ancillary and laboratory) are  listed below for reference.    Significant Diagnostic Studies: Dg Chest 2 View  Result Date: 04/27/2018 CLINICAL DATA:  57 year old female with tachycardia. EXAM: CHEST - 2 VIEW COMPARISON:  None. FINDINGS: The heart size and mediastinal contours are within normal limits. Both lungs are clear. The visualized skeletal structures are unremarkable. IMPRESSION: No active cardiopulmonary disease. Electronically Signed   By: Elgie Collard M.D.   On: 04/27/2018 00:02   Ct Head Wo Contrast  Result Date: 04/26/2018 CLINICAL DATA:  57 year old female with seizures. EXAM: CT HEAD WITHOUT CONTRAST TECHNIQUE: Contiguous axial images were obtained from the base of the skull through the vertex without intravenous contrast. COMPARISON:  None. FINDINGS: Brain: The ventricles and sulci appropriate size for patient's age. Minimal periventricular and deep white matter chronic microvascular ischemic changes noted. There is no acute intracranial hemorrhage. No mass effect or midline shift. No extra-axial fluid collection. Vascular: No hyperdense vessel or unexpected calcification. Skull: Normal. Negative for fracture or focal lesion. Sinuses/Orbits: No acute finding.  Right scleral band. Other: None IMPRESSION: No acute intracranial pathology. Electronically Signed   By: Ceasar Mons.D.  On: 04/26/2018 23:55   Mr Laqueta Jean ZO Contrast  Result Date: 04/27/2018 CLINICAL DATA:  Seizure like activity. EXAM: MRI HEAD WITHOUT AND WITH CONTRAST TECHNIQUE: Multiplanar, multiecho pulse sequences of the brain and surrounding structures were obtained without and with intravenous contrast. CONTRAST:  20mL MULTIHANCE GADOBENATE DIMEGLUMINE 529 MG/ML IV SOLN COMPARISON:  Head CT 04/26/2018 FINDINGS: The study is moderately motion degraded. Brain: There is no evidence of acute infarct, intracranial hemorrhage, mass, midline shift, or extra-axial fluid collection. The ventricles and sulci are normal. The hippocampi are grossly  symmetric in size and signal, however detailed assessment is precluded by the degree of motion artifact. No significant brain parenchymal signal abnormality is identified. No abnormal enhancement is identified. Vascular: Major intracranial vascular flow voids are preserved. Skull and upper cervical spine: Unremarkable bone marrow signal. Sinuses/Orbits: Right scleral buckle. Paranasal sinuses and mastoid air cells are clear. Other: None. IMPRESSION: Negative brain MRI within limitations of motion. Electronically Signed   By: Sebastian Ache M.D.   On: 04/27/2018 09:42    Microbiology: Recent Results (from the past 240 hour(s))  Urine culture     Status: Abnormal   Collection Time: 04/26/18 10:36 PM  Result Value Ref Range Status   Specimen Description   Final    URINE, RANDOM Performed at [facility], [address] [place], [place] [address]    Special Requests   Final    NONE Performed at [facility], [address] [place], [place] [address]    Culture MULTIPLE SPECIES PRESENT, SUGGEST RECOLLECTION (A)  Final   Report Status 04/28/2018 FINAL  Final     Labs: Basic Metabolic Panel: Recent Labs  Lab 04/26/18 2200 04/28/18 0451  NA 142 141  K 2.8* 3.3*  CL 109 107  CO2 23 25  GLUCOSE 117* 97  BUN 13 7  CREATININE 1.12* 0.72  CALCIUM 9.7 8.7*  MG 2.2  --    Liver Function Tests: Recent Labs  Lab 04/26/18 2200  AST 36  ALT 34  ALKPHOS 68  BILITOT 0.7  PROT 7.5  ALBUMIN 4.2   No results for input(s): LIPASE, AMYLASE in the last 168 hours. No results for input(s): AMMONIA in the last 168 hours. CBC: Recent Labs  Lab 04/26/18 2200 04/28/18 0451  WBC 18.6* 7.1  NEUTROABS 14.9*  --   HGB 14.9 12.5  HCT 44.2 38.5  MCV 87.0 88.1  PLT 294 221   Cardiac Enzymes: No results for input(s): CKTOTAL, CKMB, CKMBINDEX, TROPONINI in the last 168 hours. BNP: BNP (last 3 results) No results for input(s): BNP in the last 8760  hours.  ProBNP (last 3 results) No results for input(s): PROBNP in the last 8760 hours.  CBG: Recent Labs  Lab 04/27/18 0728 04/28/18 0718  GLUCAP 92 88       Signed:  Darlin Drop, MD [facility] 04/28/2018, 11:07 AM

## 2018-04-28 NOTE — Progress Notes (Addendum)
Subjective: Patient has no complaints at this time.  No further seizures.  She is very pleasant at this time.  I have explained to her that the EEG will be done today.  Had multiple questions about Tegretol and Cymbalta.  She is willing to get off his Cymbalta if needed.  Exam: Vitals:   04/28/18 0205 04/28/18 0547  BP: (!) 142/86 (!) 149/97  Pulse: 64 77  Resp: 18 18  Temp: 98.9 F (37.2 C) 98.4 F (36.9 C)  SpO2: 97% 99%    Physical Exam   HEENT-  Normocephalic, no lesions, without obvious abnormality.  Normal external eye and conjunctiva.   Extremities- Warm, dry and intact Musculoskeletal-no joint tenderness, deformity or swelling Skin-warm and dry, no hyperpigmentation, vitiligo, or suspicious lesions    Neuro:  Mental Status: Alert, oriented, thought content appropriate.  Speech fluent without evidence of aphasia.  Able to follow 3 step commands without difficulty. Cranial Nerves: II: Discs flat bilaterally; Visual fields grossly normal,  III,IV, VI: ptosis not present, extra-ocular motions intact bilaterally pupils equal, round, reactive to light and accommodation V,VII: smile symmetric, facial light touch sensation normal bilaterally VIII: hearing normal bilaterally IX,X: uvula rises symmetrically XI: bilateral shoulder shrug XII: midline tongue extension Motor: Right : Upper extremity   5/5    Left:     Upper extremity   5/5  Lower extremity   5/5     Lower extremity   5/5 Mild tremor with hands held outstretched Sensory: Pinprick and light touch intact throughout, bilaterally Deep Tendon Reflexes: 2+ and symmetric throughout Plantars: Right: downgoing   Left: downgoing Cerebellar: normal finger-to-nose, normal rapid alternating movements and normal heel-to-shin test Gait: normal gait and station    Medications:  Scheduled: . amLODipine  5 mg Oral Daily  . DULoxetine  60 mg Oral Daily  . enoxaparin (LOVENOX) injection  40 mg Subcutaneous Q24H  .  lamoTRIgine  200 mg Oral QHS  . losartan  100 mg Oral Daily  . rosuvastatin  20 mg Oral Daily  . sodium chloride flush  3 mL Intravenous Q12H    Pertinent Labs/Diagnostics: EEG pending  Dg Chest 2 View  Result Date: 04/27/2018 CLINICAL DATA:  57 year old female with tachycardia. EXAM: CHEST - 2 VIEW COMPARISON:  None. FINDINGS: The heart size and mediastinal contours are within normal limits. Both lungs are clear. The visualized skeletal structures are unremarkable. IMPRESSION: No active cardiopulmonary disease. Electronically Signed   By: Elgie Collard M.D.   On: 04/27/2018 00:02   Ct Head Wo Contrast  Result Date: 04/26/2018 CLINICAL DATA:  57 year old female with seizures. EXAM: CT HEAD WITHOUT CONTRAST TECHNIQUE: Contiguous axial images were obtained from the base of the skull through the vertex without intravenous contrast. COMPARISON:  None. FINDINGS: Brain: The ventricles and sulci appropriate size for patient's age. Minimal periventricular and deep white matter chronic microvascular ischemic changes noted. There is no acute intracranial hemorrhage. No mass effect or midline shift. No extra-axial fluid collection. Vascular: No hyperdense vessel or unexpected calcification. Skull: Normal. Negative for fracture or focal lesion. Sinuses/Orbits: No acute finding.  Right scleral band. Other: None IMPRESSION: No acute intracranial pathology. Electronically Signed   By: Elgie Collard M.D.   On: 04/26/2018 23:55   Mr Laqueta Jean EX Contrast  Result Date: 04/27/2018 CLINICAL DATA:  Seizure like activity. EXAM: MRI HEAD WITHOUT AND WITH CONTRAST TECHNIQUE: Multiplanar, multiecho pulse sequences of the brain and surrounding structures were obtained without and with intravenous contrast. CONTRAST:  20mL MULTIHANCE GADOBENATE DIMEGLUMINE 529 MG/ML IV SOLN COMPARISON:  Head CT 04/26/2018 FINDINGS: The study is moderately motion degraded. Brain: There is no evidence of acute infarct, intracranial  hemorrhage, mass, midline shift, or extra-axial fluid collection. The ventricles and sulci are normal. The hippocampi are grossly symmetric in size and signal, however detailed assessment is precluded by the degree of motion artifact. No significant brain parenchymal signal abnormality is identified. No abnormal enhancement is identified. Vascular: Major intracranial vascular flow voids are preserved. Skull and upper cervical spine: Unremarkable bone marrow signal. Sinuses/Orbits: Right scleral buckle. Paranasal sinuses and mastoid air cells are clear. Other: None. IMPRESSION: Negative brain MRI within limitations of motion. Electronically Signed   By: Sebastian AcheAllen  Grady M.D.   On: 04/27/2018 09:42        Assessment: 57 year old female with new onset seizure however some of the history also points towards possible non-organic cause.    Recommendations: -EEG -Follow-up with PCP as an outpatient and I have also explained to her she may want to consider following up with her psychologist considering her psych medications and Cymbalta -Per [facility] [place] statutes, patients with seizures are not allowed to drive until  they have been seizure-free for six months. Use caution when using heavy equipment or power tools. Avoid working on ladders or at heights. Take showers instead of baths. Ensure the water temperature is not too high on the home water heater. Do not go swimming alone. When caring for infants or small children, sit down when holding, feeding, or changing them to minimize risk of injury to the child in the event you have a seizure.   Also, Maintain good sleep hygiene. Avoid alcohol.   Felicie MornDavid Smith PA-C [facility] [phone]   04/28/2018, 10:34 AM  I have seen the patient and reviewed the above note.  Unclear if this was nonorganic, the description does sound fairly convincing for seizure.  In any case, this would be a first time unprovoked seizure at which point  antiepileptics would typically not necessarily be started.  She is already on Lamictal and I would continue this at this time.  If she were to have any further events, then I would favor increasing her Lamictal dose at that time.  I do not think that Cymbalta is necessary to stop at this time given the help that it is provided her, but if she continues to have seizures and alternatives to this could be considered as well.  Ritta SlotMcNeill Daruis Swaim, MD [facility] [phone]  If 7pm- 7am, please page neurology on call as listed in AMION.

## 2020-03-09 IMAGING — MR MR HEAD WO/W CM
10 of 14 series · 34 of 48 positions shown · IV contrast (multihance)
Comparison: Head CT 04/26/2018

CLINICAL DATA: Seizure like activity.

EXAM:
MRI HEAD WITHOUT AND WITH CONTRAST
TECHNIQUE: Multiplanar, multiecho pulse sequences of the brain and surrounding
structures were obtained without and with intravenous contrast.
CONTRAST:  20mL MULTIHANCE GADOBENATE DIMEGLUMINE 529 MG/ML IV SOLN

[Series 3: DWI · axial · 3.0mm · 1.09mm/px · z∈[-85,+65]mm · 7 of 102 slices shown (1 of 4)]
[im 1/102]
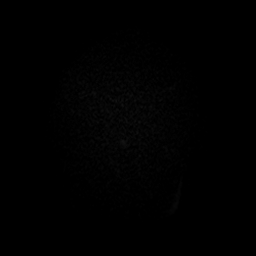
[im 17/102]
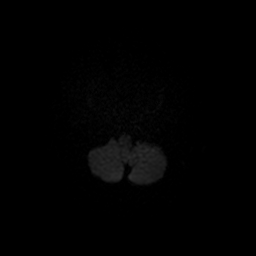
[im 34/102]
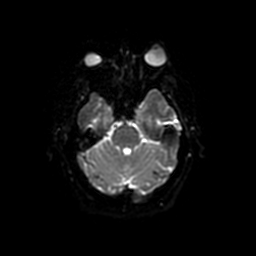
[im 51/102]
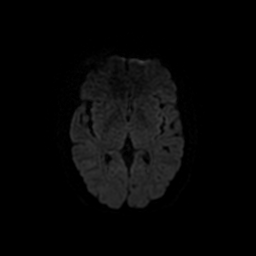
[im 68/102]
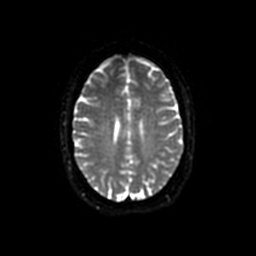
[im 85/102]
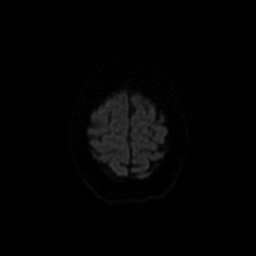
[im 102/102]
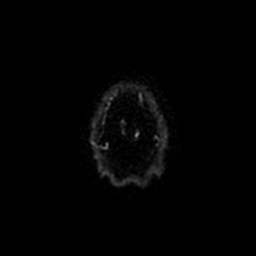

[Series 5: DWI · coronal · 3.0mm · 1.09mm/px · 7 of 100 slices shown (2 of 4)]
[im 1/100]
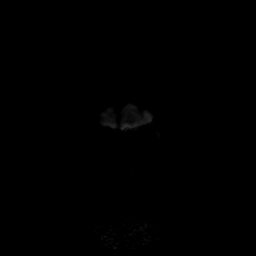
[im 17/100]
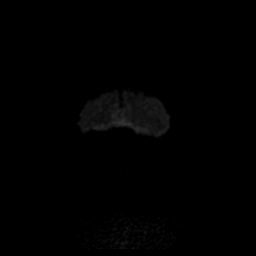
[im 34/100]
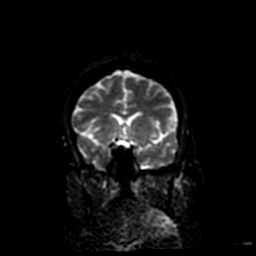
[im 50/100]
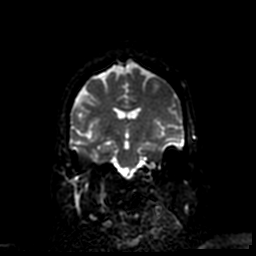
[im 67/100]
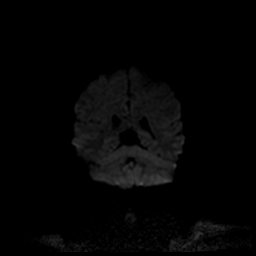
[im 83/100]
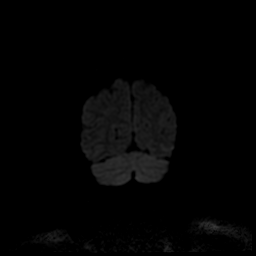
[im 100/100]
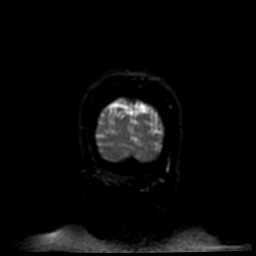

[Series 6: T2 · coronal · 3.0mm · 0.43mm/px · 3 of 34 slices shown (1 of 2)]
[im 1/34]
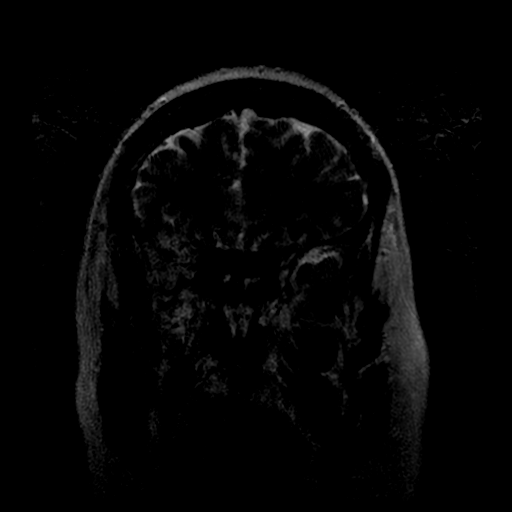
[im 17/34]
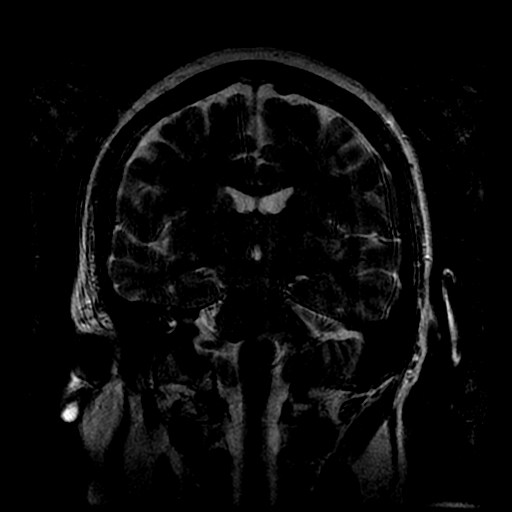
[im 34/34]
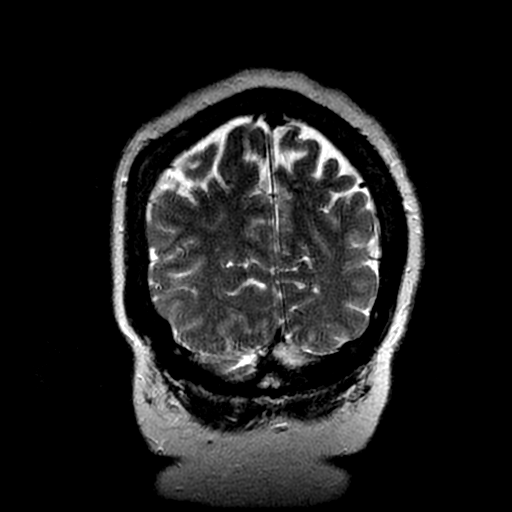

[Series 7: T2 · axial · 5.0mm · 0.43mm/px · z∈[-106,+53]mm · 2 of 28 slices shown (2 of 2)]
[im 1/28]
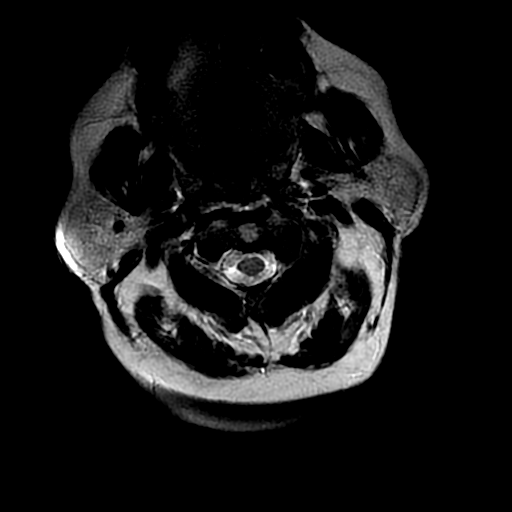
[im 28/28]
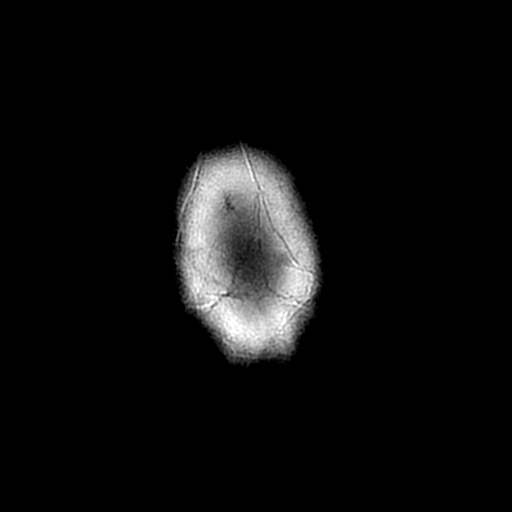

[Series 8: FLAIR · axial · 5.0mm · 0.43mm/px · z∈[-106,+53]mm · 2 of 28 slices shown]
[im 1/28]
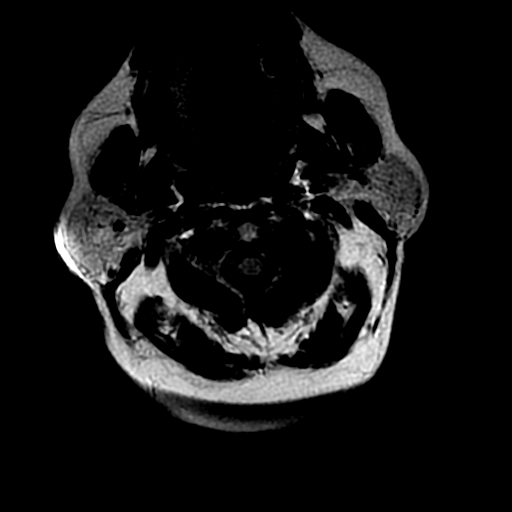
[im 28/28]
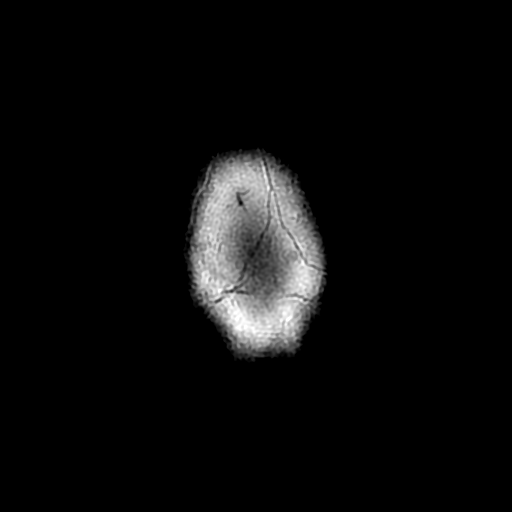

[Series 11: T2 post-contrast · coronal · 5.0mm · 0.47mm/px · 1 of 24 slices shown]
[im 1/24]
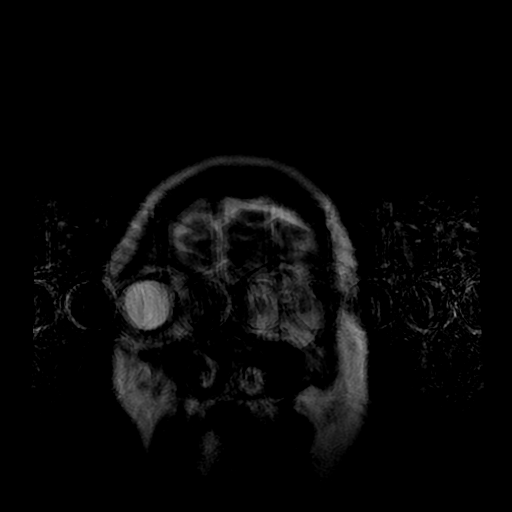

[Series 13: T1 post-contrast · coronal · 5.0mm · 0.47mm/px · 2 of 24 slices shown (1 of 2)]
[im 1/24]
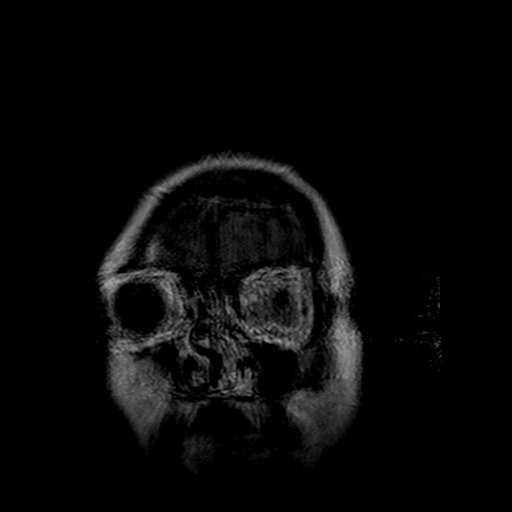
[im 24/24]
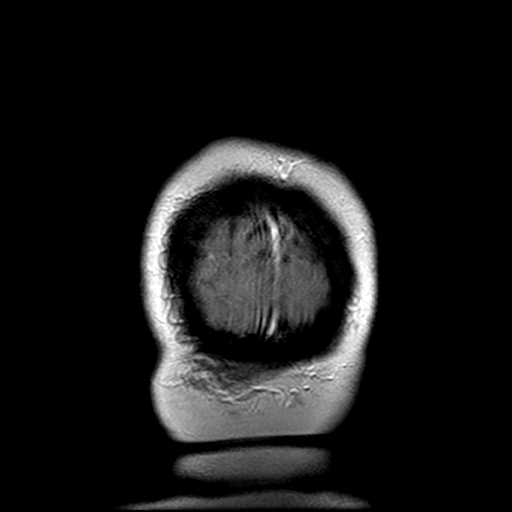

[Series 14: T1 post-contrast · sagittal · 5.0mm · 0.47mm/px · 2 of 24 slices shown (2 of 2)]
[im 1/24]
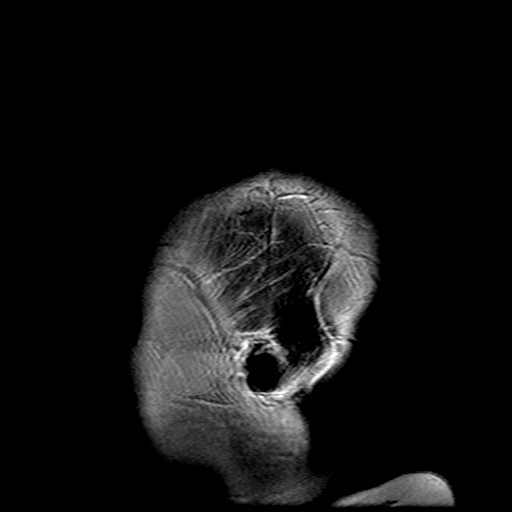
[im 24/24]
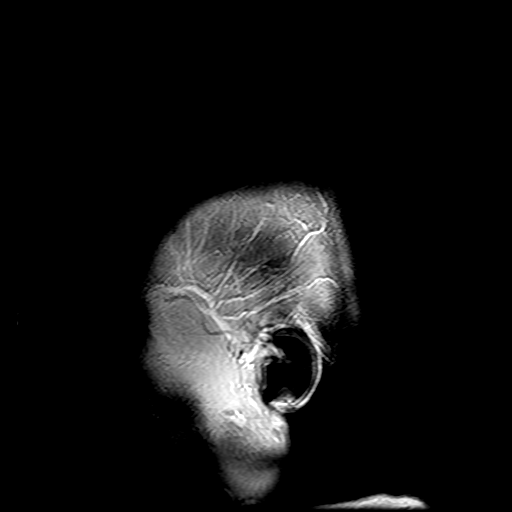

[Series 300: DWI · axial · 3.0mm · 1.09mm/px · z∈[-85,+65]mm · 4 of 51 slices shown (3 of 4)]
[im 1/51]
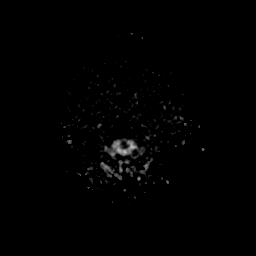
[im 17/51]
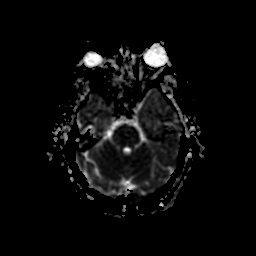
[im 34/51]
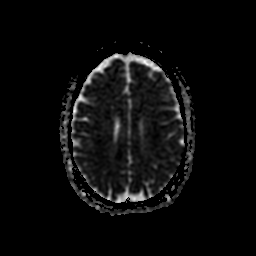
[im 51/51]
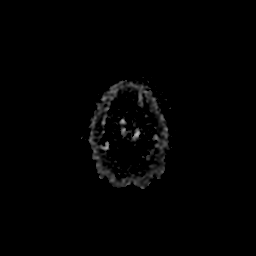

[Series 500: DWI · coronal · 3.0mm · 1.09mm/px · 4 of 50 slices shown (4 of 4)]
[im 1/50]
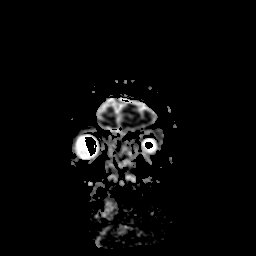
[im 17/50]
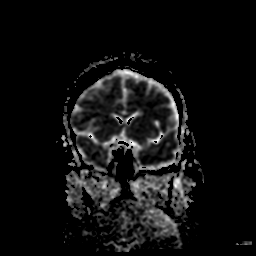
[im 33/50]
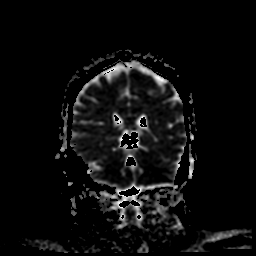
[im 50/50]
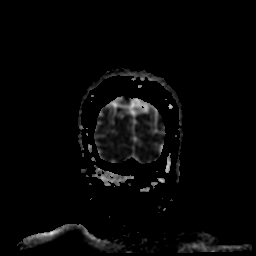

[34 of 48 positions shown; findings below may reference images not displayed]

FINDINGS: The study is moderately motion degraded.

Brain: There is no evidence of acute infarct, intracranial
hemorrhage, mass, midline shift, or extra-axial fluid collection.
The ventricles and sulci are normal. The hippocampi are grossly
symmetric in size and signal, however detailed assessment is
precluded by the degree of motion artifact. No significant brain
parenchymal signal abnormality is identified. No abnormal
enhancement is identified.

Vascular: Major intracranial vascular flow voids are preserved.

Skull and upper cervical spine: Unremarkable bone marrow signal.

Sinuses/Orbits: Right scleral buckle. Paranasal sinuses and mastoid
air cells are clear.

Other: None.
IMPRESSION: Negative brain MRI within limitations of motion.

## 2021-11-18 DIAGNOSIS — M797 Fibromyalgia: Secondary | ICD-10-CM | POA: Insufficient documentation

## 2021-11-18 DIAGNOSIS — F3132 Bipolar disorder, current episode depressed, moderate: Secondary | ICD-10-CM | POA: Insufficient documentation

## 2021-11-18 DIAGNOSIS — K219 Gastro-esophageal reflux disease without esophagitis: Secondary | ICD-10-CM | POA: Insufficient documentation

## 2021-11-18 DIAGNOSIS — M1991 Primary osteoarthritis, unspecified site: Secondary | ICD-10-CM | POA: Insufficient documentation

## 2021-11-18 DIAGNOSIS — K449 Diaphragmatic hernia without obstruction or gangrene: Secondary | ICD-10-CM | POA: Insufficient documentation

## 2022-09-29 ENCOUNTER — Emergency Department (HOSPITAL_COMMUNITY)
Admission: EM | Admit: 2022-09-29 | Discharge: 2022-09-30 | Disposition: A | Discharge status: Home / Self Care | Payer: Medicare HMO | Attending: Emergency Medicine | Admitting: Emergency Medicine

## 2022-09-29 ENCOUNTER — Encounter (HOSPITAL_COMMUNITY): Payer: Self-pay

## 2022-09-29 ENCOUNTER — Other Ambulatory Visit: Payer: Self-pay

## 2022-09-29 DIAGNOSIS — I10 Essential (primary) hypertension: Secondary | ICD-10-CM | POA: Insufficient documentation

## 2022-09-29 DIAGNOSIS — F3177 Bipolar disorder, in partial remission, most recent episode mixed: Secondary | ICD-10-CM | POA: Diagnosis not present

## 2022-09-29 DIAGNOSIS — Z79899 Other long term (current) drug therapy: Secondary | ICD-10-CM | POA: Insufficient documentation

## 2022-09-29 DIAGNOSIS — F3010 Manic episode without psychotic symptoms, unspecified: Secondary | ICD-10-CM

## 2022-09-29 DIAGNOSIS — F319 Bipolar disorder, unspecified: Secondary | ICD-10-CM | POA: Diagnosis present

## 2022-09-29 HISTORY — DX: Sciatica, unspecified side: M54.30

## 2022-09-29 HISTORY — DX: Post-traumatic stress disorder, unspecified: F43.10

## 2022-09-29 HISTORY — DX: Pure hypercholesterolemia, unspecified: E78.00

## 2022-09-29 HISTORY — DX: Other intervertebral disc degeneration, lumbosacral region without mention of lumbar back pain or lower extremity pain: M51.379

## 2022-09-29 HISTORY — DX: Anxiety disorder, unspecified: F41.9

## 2022-09-29 HISTORY — DX: Gastro-esophageal reflux disease without esophagitis: K21.9

## 2022-09-29 HISTORY — DX: Diaphragmatic hernia without obstruction or gangrene: K44.9

## 2022-09-29 HISTORY — DX: Other intervertebral disc degeneration, lumbosacral region: M51.37

## 2022-09-29 HISTORY — DX: Fibromyalgia: M79.7

## 2022-09-29 LAB — COMPREHENSIVE METABOLIC PANEL
ALT: 16 U/L (ref 0–44)
AST: 18 U/L (ref 15–41)
Albumin: 3.6 g/dL (ref 3.5–5.0)
Alkaline Phosphatase: 57 U/L (ref 38–126)
Anion gap: 9 (ref 5–15)
BUN: 16 mg/dL (ref 8–23)
CO2: 26 mmol/L (ref 22–32)
Calcium: 8.8 mg/dL — ABNORMAL LOW (ref 8.9–10.3)
Chloride: 104 mmol/L (ref 98–111)
Creatinine, Ser: 1.09 mg/dL — ABNORMAL HIGH (ref 0.44–1.00)
GFR, Estimated: 58 mL/min — ABNORMAL LOW (ref >60)
Glucose, Bld: 144 mg/dL — ABNORMAL HIGH (ref 70–99)
Potassium: 3.1 mmol/L — ABNORMAL LOW (ref 3.5–5.1)
Sodium: 139 mmol/L (ref 135–145)
Total Bilirubin: 0.6 mg/dL (ref 0.3–1.2)
Total Protein: 6.7 g/dL (ref 6.5–8.1)

## 2022-09-29 LAB — CBC WITH DIFFERENTIAL/PLATELET
Abs Immature Granulocytes: 0.03 10*3/uL (ref 0.00–0.07)
Basophils Absolute: 0.1 10*3/uL (ref 0.0–0.1)
Basophils Relative: 1 %
Eosinophils Absolute: 0.1 10*3/uL (ref 0.0–0.5)
Eosinophils Relative: 1 %
HCT: 42.8 % (ref 36.0–46.0)
Hemoglobin: 13.6 g/dL (ref 12.0–15.0)
Immature Granulocytes: 0 %
Lymphocytes Relative: 37 %
Lymphs Abs: 4.1 10*3/uL — ABNORMAL HIGH (ref 0.7–4.0)
MCH: 28.2 pg (ref 26.0–34.0)
MCHC: 31.8 g/dL (ref 30.0–36.0)
MCV: 88.8 fL (ref 80.0–100.0)
Monocytes Absolute: 0.7 10*3/uL (ref 0.1–1.0)
Monocytes Relative: 6 %
Neutro Abs: 6 10*3/uL (ref 1.7–7.7)
Neutrophils Relative %: 55 %
Platelets: 294 10*3/uL (ref 150–400)
RBC: 4.82 MIL/uL (ref 3.87–5.11)
RDW: 13.6 % (ref 11.5–15.5)
WBC: 10.9 10*3/uL — ABNORMAL HIGH (ref 4.0–10.5)
nRBC: 0 % (ref 0.0–0.2)

## 2022-09-29 LAB — RAPID URINE DRUG SCREEN, HOSP PERFORMED
Amphetamines: NOT DETECTED
Barbiturates: NOT DETECTED
Benzodiazepines: POSITIVE — AB
Cocaine: NOT DETECTED
Opiates: POSITIVE — AB
Tetrahydrocannabinol: POSITIVE — AB

## 2022-09-29 LAB — ETHANOL: Alcohol, Ethyl (B): <10 mg/dL (ref <10)

## 2022-09-29 LAB — MAGNESIUM: Magnesium: 2.2 mg/dL (ref 1.7–2.4)

## 2022-09-29 LAB — COLOGUARD: Cologuard: NEGATIVE

## 2022-09-29 MED ORDER — ROSUVASTATIN CALCIUM 20 MG PO TABS
20.0000 mg | ORAL_TABLET | Freq: Every day | ORAL | Status: DC
  Administered 2022-09-30: 20 mg via ORAL
  Filled 2022-09-29: qty 1

## 2022-09-29 MED ORDER — AMLODIPINE BESYLATE 5 MG PO TABS
10.0000 mg | ORAL_TABLET | Freq: Every day | ORAL | Status: DC
  Administered 2022-09-30: 10 mg via ORAL
  Filled 2022-09-29: qty 2

## 2022-09-29 MED ORDER — ESCITALOPRAM OXALATE 10 MG PO TABS
20.0000 mg | ORAL_TABLET | Freq: Every day | ORAL | Status: DC
  Administered 2022-09-30: 20 mg via ORAL
  Filled 2022-09-29: qty 2

## 2022-09-29 MED ORDER — LOSARTAN POTASSIUM 50 MG PO TABS
100.0000 mg | ORAL_TABLET | Freq: Every day | ORAL | Status: DC
  Administered 2022-09-30: 100 mg via ORAL
  Filled 2022-09-29: qty 2

## 2022-09-29 MED ORDER — FAMOTIDINE 20 MG PO TABS
20.0000 mg | ORAL_TABLET | Freq: Every day | ORAL | Status: DC
  Administered 2022-09-30: 20 mg via ORAL
  Filled 2022-09-29: qty 1

## 2022-09-29 MED ORDER — POTASSIUM CHLORIDE CRYS ER 20 MEQ PO TBCR
40.0000 meq | EXTENDED_RELEASE_TABLET | Freq: Once | ORAL | Status: AC
Start: 2022-09-29 — End: 2022-09-29
  Administered 2022-09-29: 40 meq via ORAL
  Filled 2022-09-29: qty 2

## 2022-09-29 MED ORDER — LAMOTRIGINE 100 MG PO TABS
200.0000 mg | ORAL_TABLET | Freq: Every day | ORAL | Status: DC
  Administered 2022-09-30: 200 mg via ORAL
  Filled 2022-09-29: qty 2

## 2022-09-29 MED ORDER — MELOXICAM 15 MG PO TABS
15.0000 mg | ORAL_TABLET | Freq: Every day | ORAL | Status: DC | PRN
  Administered 2022-09-30: 15 mg via ORAL
  Filled 2022-09-29 (×2): qty 1

## 2022-09-29 MED ORDER — LORAZEPAM 1 MG PO TABS
1.0000 mg | ORAL_TABLET | Freq: Four times a day (QID) | ORAL | Status: DC | PRN
  Administered 2022-09-30: 1 mg via ORAL
  Filled 2022-09-29: qty 1

## 2022-09-29 NOTE — ED Provider Triage Note (Signed)
Emergency Medicine Provider Triage Evaluation Note  Brandi Kerr , a 61 y.o. female  was evaluated in triage.  Pt complains of 3 hr sleep 48 hours, panic attafch: teroro difficulty breathing. Ativan lexapro lamictal.  Review of Systems  Positive: *** Negative: ***  Physical Exam  BP (!) 139/94 (BP Location: Left Arm)   Pulse 97   Temp 98.3 F (36.8 C) (Oral)   Resp 18   Ht 5\' 7"  (1.702 m)   Wt 110.2 kg   SpO2 96%   BMI 38.06 kg/m  Gen:   Awake, no distress  *** Resp:  Normal effort *** MSK:   Moves extremities without difficulty *** Other:  ***  Medical Decision Making  Medically screening exam initiated at 7:58 PM.  Appropriate orders placed.  Brandi Kerr was informed that the remainder of the evaluation will be completed by another provider, this initial triage assessment does not replace that evaluation, and the importance of remaining in the ED until their evaluation is complete.  ***

## 2022-09-29 NOTE — ED Provider Notes (Deleted)
[facility]-EMERGENCY DEPT Provider Note   CSN: [id number] Arrival date & time: 09/29/22  1926     History  Chief Complaint  Patient presents with   Manic Behavior    Brandi Kerr is a 61 y.o. female.  HPI   61 year old female presents emergency department with complaints of "manic type behavior.  "Patient is in town visiting from [place].  She has a history of bipolar 1 disorder with intermittent manic type episodes.  She reports symptoms of distractibility, irritability, flight of ideas.  Patient reports only sleeping 4 hours in the past 48 to 72 hours.  She denies any auditory hallucination, suicidal/homicidal ideation.  Reports history of similar symptoms in the past before "full on psychosis."  Takes Lamictal for bipolar 1 disorder and states she has been compliant with her medicines.  Denies fever, chills, night sweats, chest pain, shortness of breath, abdominal pain, nausea, vomiting, urinary symptoms, change in bowel habits.  Denies alcohol or illicit drug/substance use.  Past medical history significant for bipolar disorder, fibromyalgia, hypertension, PTSD, hypercholesterolemia  Home Medications Prior to Admission medications   Medication Sig Start Date End Date Taking? Authorizing Provider  amLODipine (NORVASC) 5 MG tablet Take 5 mg by mouth daily. 04/11/18   [provider]  DULoxetine (CYMBALTA) 60 MG capsule Take 60 mg by mouth daily. 03/14/18   [provider]  lamoTRIgine (LAMICTAL) 200 MG tablet Take 200 mg by mouth at bedtime. 04/01/18   [provider]  losartan (COZAAR) 100 MG tablet Take 100 mg by mouth daily. 04/11/18   [provider]  rosuvastatin (CRESTOR) 20 MG tablet Take 20 mg by mouth daily. 03/18/18   [provider]      Allergies    Effexor [venlafaxine] and Lithium    Review of Systems   Review of Systems  All other systems reviewed and are negative.   Physical Exam Updated Vital  Signs BP (!) 139/94 (BP Location: Left Arm)   Pulse 97   Temp 98.3 F (36.8 C) (Oral)   Resp 18   Ht 5\' 7"  (1.702 m)   Wt 110.2 kg   SpO2 96%   BMI 38.06 kg/m  Physical Exam Vitals and nursing note reviewed.  Constitutional:      General: She is not in acute distress.    Appearance: She is well-developed.  HENT:     Head: Normocephalic and atraumatic.  Eyes:     Conjunctiva/sclera: Conjunctivae normal.  Cardiovascular:     Rate and Rhythm: Normal rate and regular rhythm.     Heart sounds: No murmur heard. Pulmonary:     Effort: Pulmonary effort is normal. No respiratory distress.     Breath sounds: Normal breath sounds.  Abdominal:     Palpations: Abdomen is soft.     Tenderness: There is no abdominal tenderness.  Musculoskeletal:        General: No swelling.     Cervical back: Neck supple.  Skin:    General: Skin is warm and dry.     Capillary Refill: Capillary refill takes less than 2 seconds.  Neurological:     Mental Status: She is alert.  Psychiatric:        Mood and Affect: Mood normal.     ED Results / Procedures / Treatments   Labs (all labs ordered are listed, but only abnormal results are displayed) Labs Reviewed  COMPREHENSIVE METABOLIC PANEL - Abnormal; Notable for the following components:      Result  Value   Potassium 3.1 (*)    Glucose, Bld 144 (*)    Creatinine, Ser 1.09 (*)    Calcium 8.8 (*)    GFR, Estimated 58 (*)    All other components within normal limits  RAPID URINE DRUG SCREEN, HOSP PERFORMED - Abnormal; Notable for the following components:   Opiates POSITIVE (*)    Benzodiazepines POSITIVE (*)    Tetrahydrocannabinol POSITIVE (*)    All other components within normal limits  CBC WITH DIFFERENTIAL/PLATELET - Abnormal; Notable for the following components:   WBC 10.9 (*)    Lymphs Abs 4.1 (*)    All other components within normal limits  ETHANOL  MAGNESIUM    EKG None  Radiology No results  found.  Procedures Procedures    Medications Ordered in ED Medications  lamoTRIgine (LAMICTAL) tablet 200 mg (has no administration in time range)  escitalopram (LEXAPRO) tablet 20 mg (has no administration in time range)  LORazepam (ATIVAN) tablet 1 mg (has no administration in time range)  amLODipine (NORVASC) tablet 10 mg (has no administration in time range)  losartan (COZAAR) tablet 100 mg (has no administration in time range)  famotidine (PEPCID) tablet 20 mg (has no administration in time range)  rosuvastatin (CRESTOR) tablet 20 mg (has no administration in time range)  meloxicam (MOBIC) tablet 15 mg (has no administration in time range)  potassium chloride SA (KLOR-CON M) CR tablet 40 mEq (40 mEq Oral Given 09/29/22 2154)    ED Course/ Medical Decision Making/ A&P                           Medical Decision Making Amount and/or Complexity of Data Reviewed Labs: ordered.  Risk Prescription drug management.   This patient presents to the ED for concern of manic episodes, this involves an extensive number of treatment options, and is a complaint that carries with it a high risk of complications and morbidity.  The differential diagnosis includes alcohol/illicit drug/substance use, electrolyte abnormalities, infectious process, insomnia, medication side effect, mania, psychosis   Co morbidities that complicate the patient evaluation  See HPI   Additional history obtained:  Additional history obtained from EMR External records from outside source obtained and reviewed including hospital records   Lab Tests:  I Ordered, and personally interpreted labs.  The pertinent results include: Mild leukocytosis of 10.9.  No evidence of anemia.  Platelets within normal range.  Electrolytes significant for potassium 3.10 which was supplemented orally; magnesium within normal limits.  Mild decrease in GFR with a creatinine of 1.09, GFR 58 and BUN of 16.  Ethanol less than 10.  UDS  pending.   Imaging Studies ordered:  N/a   Cardiac Monitoring: / EKG:  The patient was maintained on a cardiac monitor.  I personally viewed and interpreted the cardiac monitored which showed an underlying rhythm of: Sinus rhythm with nonspecific T wave changes in V1.   Consultations Obtained:  Requested consultation of TTS.  Consultation pending   Problem List / ED Course / Critical interventions / Medication management  Manic behavior I ordered medication including potassium chloride for hypokalemia   Reevaluation of the patient after these medicines showed that the patient improved I have reviewed the patients home medicines and have made adjustments as needed   Social Determinants of Health:  Denies tobacco, licit drug use.   Test / Admission - Considered:  Manic behavior Vitals signs significant for mild hypertension with a blood  pressure 139/94.  Recommend close follow-up with primary care regarding elevated blood pressure.. Otherwise within normal range and stable throughout visit. Laboratory/imaging studies significant for: See above Patient experiencing current manic behavior.  Patient elicits significant concern given prior quick development into overt psychosis given patient's current symptoms as well as history of prior progression, TTS consultation was made.  At home medicines ordered by the emergency department.  Consultation pending at this time.  Patient deemed cleared medically for psych consultation.  Patient stable.         Final Clinical Impression(s) / ED Diagnoses Final diagnoses:  Manic behavior [facility])    Rx / DC Orders ED Discharge Orders     None         Peter Garter, [place] 09/29/22 2349

## 2022-09-29 NOTE — ED Triage Notes (Signed)
Pt states that she is in from [place] and is having a manic episode that she is unable to manage. Pt reports having bipolar type 1 and is taking her medications as prescribed.

## 2022-09-30 DIAGNOSIS — F3177 Bipolar disorder, in partial remission, most recent episode mixed: Secondary | ICD-10-CM

## 2022-09-30 MED ORDER — TRAZODONE HCL 50 MG PO TABS: 50.0000 mg | ORAL_TABLET | Freq: Every evening | ORAL | 0 refills | Status: DC | PRN
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
## 2022-09-30 NOTE — ED Notes (Addendum)
Patient discharged off unit to home per provider. Patient alert, cooperative, calm, no s/s of distress at this time.  Discharge information given to patient with acknowledged understanding. Belongings given topatient. Patient ambulatory off unit, escorted by RN. Patient transported by friend

## 2022-09-30 NOTE — ED Provider Notes (Signed)
[facility]-EMERGENCY DEPT Provider Note   CSN: [id number] Arrival date & time: 09/29/22  1926     History  Chief Complaint  Patient presents with   Manic Behavior    Brandi Kerr is a 61 y.o. female.  HPI   61 year old female presents emergency department with complaints of "manic type behavior.  "Patient is in town visiting from [place].  She has a history of bipolar 1 disorder with intermittent manic type episodes.  She reports symptoms of distractibility, irritability, flight of ideas.  Patient reports only sleeping 4 hours in the past 48 to 72 hours.  She denies any auditory hallucination, suicidal/homicidal ideation.  Reports history of similar symptoms in the past before "full on psychosis."  Takes Lamictal for bipolar 1 disorder and states she has been compliant with her medicines.  Denies fever, chills, night sweats, chest pain, shortness of breath, abdominal pain, nausea, vomiting, urinary symptoms, change in bowel habits.  Denies alcohol or illicit drug/substance use.  Past medical history significant for bipolar disorder, fibromyalgia, hypertension, PTSD, hypercholesterolemia  Home Medications Prior to Admission medications   Medication Sig Start Date End Date Taking? Authorizing Provider  amLODipine (NORVASC) 5 MG tablet Take 5 mg by mouth daily. 04/11/18   [provider]  DULoxetine (CYMBALTA) 60 MG capsule Take 60 mg by mouth daily. 03/14/18   [provider]  lamoTRIgine (LAMICTAL) 200 MG tablet Take 200 mg by mouth at bedtime. 04/01/18   [provider]  losartan (COZAAR) 100 MG tablet Take 100 mg by mouth daily. 04/11/18   [provider]  rosuvastatin (CRESTOR) 20 MG tablet Take 20 mg by mouth daily. 03/18/18   [provider]      Allergies    Effexor [venlafaxine] and Lithium    Review of Systems   Review of Systems  All other systems reviewed and are negative.   Physical Exam Updated Vital  Signs BP (!) 139/94 (BP Location: Left Arm)   Pulse 97   Temp 98.3 F (36.8 C) (Oral)   Resp 18   Ht 5\' 7"  (1.702 m)   Wt 110.2 kg   SpO2 96%   BMI 38.06 kg/m  Physical Exam Vitals and nursing note reviewed.  Constitutional:      General: She is not in acute distress.    Appearance: She is well-developed.  HENT:     Head: Normocephalic and atraumatic.  Eyes:     Conjunctiva/sclera: Conjunctivae normal.  Cardiovascular:     Rate and Rhythm: Normal rate and regular rhythm.     Heart sounds: No murmur heard. Pulmonary:     Effort: Pulmonary effort is normal. No respiratory distress.     Breath sounds: Normal breath sounds.  Abdominal:     Palpations: Abdomen is soft.     Tenderness: There is no abdominal tenderness.  Musculoskeletal:        General: No swelling.     Cervical back: Neck supple.  Skin:    General: Skin is warm and dry.     Capillary Refill: Capillary refill takes less than 2 seconds.  Neurological:     Mental Status: She is alert.  Psychiatric:        Mood and Affect: Mood normal.     ED Results / Procedures / Treatments   Labs (all labs ordered are listed, but only abnormal results are displayed) Labs Reviewed  COMPREHENSIVE METABOLIC PANEL - Abnormal; Notable for the following components:      Result  Value   Potassium 3.1 (*)    Glucose, Bld 144 (*)    Creatinine, Ser 1.09 (*)    Calcium 8.8 (*)    GFR, Estimated 58 (*)    All other components within normal limits  RAPID URINE DRUG SCREEN, HOSP PERFORMED - Abnormal; Notable for the following components:   Opiates POSITIVE (*)    Benzodiazepines POSITIVE (*)    Tetrahydrocannabinol POSITIVE (*)    All other components within normal limits  CBC WITH DIFFERENTIAL/PLATELET - Abnormal; Notable for the following components:   WBC 10.9 (*)    Lymphs Abs 4.1 (*)    All other components within normal limits  ETHANOL  MAGNESIUM    EKG None  Radiology No results  found.  Procedures Procedures    Medications Ordered in ED Medications  lamoTRIgine (LAMICTAL) tablet 200 mg (has no administration in time range)  escitalopram (LEXAPRO) tablet 20 mg (has no administration in time range)  LORazepam (ATIVAN) tablet 1 mg (has no administration in time range)  amLODipine (NORVASC) tablet 10 mg (has no administration in time range)  losartan (COZAAR) tablet 100 mg (has no administration in time range)  famotidine (PEPCID) tablet 20 mg (has no administration in time range)  rosuvastatin (CRESTOR) tablet 20 mg (has no administration in time range)  meloxicam (MOBIC) tablet 15 mg (has no administration in time range)  potassium chloride SA (KLOR-CON M) CR tablet 40 mEq (40 mEq Oral Given 09/29/22 2154)    ED Course/ Medical Decision Making/ A&P                           Medical Decision Making Amount and/or Complexity of Data Reviewed Labs: ordered.  Risk Prescription drug management.   This patient presents to the ED for concern of manic episodes, this involves an extensive number of treatment options, and is a complaint that carries with it a high risk of complications and morbidity.  The differential diagnosis includes alcohol/illicit drug/substance use, electrolyte abnormalities, infectious process, insomnia, medication side effect, mania, psychosis   Co morbidities that complicate the patient evaluation  See HPI   Additional history obtained:  Additional history obtained from EMR External records from outside source obtained and reviewed including hospital records   Lab Tests:  I Ordered, and personally interpreted labs.  The pertinent results include: Mild leukocytosis of 10.9.  No evidence of anemia.  Platelets within normal range.  Electrolytes significant for potassium 3.10 which was supplemented orally; magnesium within normal limits.  Mild decrease in GFR with a creatinine of 1.09, GFR 58 and BUN of 16.  Ethanol less than 10.  UDS  pending.   Imaging Studies ordered:  N/a   Cardiac Monitoring: / EKG:  The patient was maintained on a cardiac monitor.  I personally viewed and interpreted the cardiac monitored which showed an underlying rhythm of: Sinus rhythm with nonspecific T wave changes in V1.   Consultations Obtained:  Requested consultation of TTS.  Consultation pending   Problem List / ED Course / Critical interventions / Medication management  Manic behavior I ordered medication including potassium chloride for hypokalemia   Reevaluation of the patient after these medicines showed that the patient improved I have reviewed the patients home medicines and have made adjustments as needed   Social Determinants of Health:  Denies tobacco, licit drug use.   Test / Admission - Considered:  Manic behavior Vitals signs significant for mild hypertension with a blood  pressure 139/94.  Recommend close follow-up with primary care regarding elevated blood pressure.. Otherwise within normal range and stable throughout visit. Laboratory/imaging studies significant for: See above Patient experiencing current manic behavior.  Patient elicits significant concern given prior quick development into overt psychosis given patient's current symptoms as well as history of prior progression, TTS consultation was made.  At home medicines ordered by the emergency department.  Consultation pending at this time.  Patient deemed cleared medically for psych consultation.  Patient stable.         Final Clinical Impression(s) / ED Diagnoses Final diagnoses:  Manic behavior [facility])    Rx / DC Orders ED Discharge Orders     None         Peter Garter, [place] 09/29/22 2349    Peter Garter, PA 09/30/22 0011    Wynetta Fines, MD 09/30/22 1454

## 2022-09-30 NOTE — ED Provider Notes (Addendum)
Emergency Medicine Observation Re-evaluation Note  Brandi Kerr is a 61 y.o. female, seen on rounds today.  Pt initially presented to the ED for complaints of Manic Behavior Currently, the patient is resting comfortably no acute distress.  Physical Exam  BP (!) 139/94 (BP Location: Left Arm)   Pulse 97   Temp 98.3 F (36.8 C) (Oral)   Resp 18   Ht 5\' 7"  (1.702 m)   Wt 110.2 kg   SpO2 96%   BMI 38.06 kg/m  Physical Exam General: Appears to be resting comfortably in bed, no acute distress. Cardiac: Regular rate, normal heart rate, non-emergent blood pressure for this morning's vitals. Lungs: No increased work of breathing.  Equal chest rise appreciated Psych: Calm, asleep in bed.   ED Course / MDM  EKG:   I have reviewed the labs performed to date as well as medications administered while in observation.    Plan  Current plan is for observation with psychiatric reassessment this morning.  Patient was polysubstance positive on her UDS.  Reassessment for ongoing psychosis later today.    , MD 09/30/22 [phone] I was informed that psychiatry has reassessed the patient.  They believe patient's symptoms have now stabilized.  They believe patient is stable for outpatient care and management.  Given these findings, patient discharged with no further acute events.   [id number], MD 09/30/22 1318
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
## 2022-09-30 NOTE — Discharge Instructions (Addendum)
  Discharge recommendations:  Patient is to take medications as prescribed. Please see information for follow-up appointment with psychiatry and therapy. Please follow up with your primary care provider for all medical related needs.   Therapy: We recommend that patient participate in individual therapy to address mental health concerns.  Medications: The patient is to contact a medical professional and/or outpatient provider to address any new side effects that develop. Patient should update outpatient providers of any new medications and/or medication changes.   Atypical antipsychotics: If you are prescribed an atypical antipsychotic, it is recommended that your height, weight, BMI, blood pressure, fasting lipid panel, and fasting blood sugar be monitored by your outpatient providers.  Safety:  The patient should abstain from use of illicit substances/drugs and abuse of any medications. If symptoms worsen or do not continue to improve or if the patient becomes actively suicidal or homicidal then it is recommended that the patient return to the closest hospital emergency department, the [facility], or call 911 for further evaluation and treatment. National Suicide Prevention Lifeline 1-800-SUICIDE or 1-800-273-8255.  About 988 988 offers 24/7 access to trained crisis counselors who can help people experiencing mental health-related distress. People can call or text 988 or chat 988lifeline.org for themselves or if they are worried about a loved one who may need crisis support.  Crisis Mobile: [facility]:                     1-877-626-1772 (for crisis response 24 hours a day) [facility] Hotline:                                            1-800-256-2452  

## 2022-10-07 ENCOUNTER — Other Ambulatory Visit: Payer: Self-pay

## 2022-10-07 ENCOUNTER — Emergency Department (HOSPITAL_COMMUNITY)
Admission: EM | Admit: 2022-10-07 | Discharge: 2022-10-10 | Disposition: A | Discharge status: Other Acute Inpatient Hospital | Payer: Medicare HMO | Attending: Emergency Medicine | Admitting: Emergency Medicine

## 2022-10-07 ENCOUNTER — Encounter (HOSPITAL_COMMUNITY): Payer: Self-pay

## 2022-10-07 DIAGNOSIS — Z79899 Other long term (current) drug therapy: Secondary | ICD-10-CM | POA: Insufficient documentation

## 2022-10-07 DIAGNOSIS — F29 Unspecified psychosis not due to a substance or known physiological condition: Secondary | ICD-10-CM | POA: Diagnosis not present

## 2022-10-07 DIAGNOSIS — Z1152 Encounter for screening for COVID-19: Secondary | ICD-10-CM | POA: Diagnosis not present

## 2022-10-07 DIAGNOSIS — F309 Manic episode, unspecified: Secondary | ICD-10-CM | POA: Insufficient documentation

## 2022-10-07 DIAGNOSIS — F319 Bipolar disorder, unspecified: Secondary | ICD-10-CM | POA: Insufficient documentation

## 2022-10-07 DIAGNOSIS — Z1339 Encounter for screening examination for other mental health and behavioral disorders: Secondary | ICD-10-CM | POA: Insufficient documentation

## 2022-10-07 DIAGNOSIS — F22 Delusional disorders: Secondary | ICD-10-CM | POA: Diagnosis present

## 2022-10-07 LAB — COMPREHENSIVE METABOLIC PANEL
ALT: 16 U/L (ref 0–44)
AST: 20 U/L (ref 15–41)
Albumin: 4.2 g/dL (ref 3.5–5.0)
Alkaline Phosphatase: 57 U/L (ref 38–126)
Anion gap: 10 (ref 5–15)
BUN: 25 mg/dL — ABNORMAL HIGH (ref 8–23)
CO2: 21 mmol/L — ABNORMAL LOW (ref 22–32)
Calcium: 9.3 mg/dL (ref 8.9–10.3)
Chloride: 108 mmol/L (ref 98–111)
Creatinine, Ser: 1.15 mg/dL — ABNORMAL HIGH (ref 0.44–1.00)
GFR, Estimated: 54 mL/min — ABNORMAL LOW (ref >60)
Glucose, Bld: 111 mg/dL — ABNORMAL HIGH (ref 70–99)
Potassium: 3.3 mmol/L — ABNORMAL LOW (ref 3.5–5.1)
Sodium: 139 mmol/L (ref 135–145)
Total Bilirubin: 0.7 mg/dL (ref 0.3–1.2)
Total Protein: 7.1 g/dL (ref 6.5–8.1)

## 2022-10-07 LAB — ETHANOL: Alcohol, Ethyl (B): <10 mg/dL (ref <10)

## 2022-10-07 LAB — CBC WITH DIFFERENTIAL/PLATELET
Abs Immature Granulocytes: 0.03 10*3/uL (ref 0.00–0.07)
Basophils Absolute: 0 10*3/uL (ref 0.0–0.1)
Basophils Relative: 0 %
Eosinophils Absolute: 0 10*3/uL (ref 0.0–0.5)
Eosinophils Relative: 0 %
HCT: 41.4 % (ref 36.0–46.0)
Hemoglobin: 13.1 g/dL (ref 12.0–15.0)
Immature Granulocytes: 0 %
Lymphocytes Relative: 32 %
Lymphs Abs: 3.4 10*3/uL (ref 0.7–4.0)
MCH: 28.1 pg (ref 26.0–34.0)
MCHC: 31.6 g/dL (ref 30.0–36.0)
MCV: 88.7 fL (ref 80.0–100.0)
Monocytes Absolute: 0.7 10*3/uL (ref 0.1–1.0)
Monocytes Relative: 7 %
Neutro Abs: 6.2 10*3/uL (ref 1.7–7.7)
Neutrophils Relative %: 61 %
Platelets: 260 10*3/uL (ref 150–400)
RBC: 4.67 MIL/uL (ref 3.87–5.11)
RDW: 13.6 % (ref 11.5–15.5)
WBC: 10.4 10*3/uL (ref 4.0–10.5)
nRBC: 0 % (ref 0.0–0.2)

## 2022-10-07 LAB — SARS CORONAVIRUS 2 BY RT PCR: SARS Coronavirus 2 by RT PCR: NEGATIVE

## 2022-10-07 MED ORDER — LORAZEPAM 2 MG/ML IJ SOLN
2.0000 mg | Freq: Once | INTRAMUSCULAR | Status: AC
  Administered 2022-10-07: 2 mg via INTRAMUSCULAR
  Filled 2022-10-07: qty 1

## 2022-10-07 MED ORDER — DULOXETINE HCL 30 MG PO CPEP: 60.0000 mg | ORAL_CAPSULE | Freq: Every day | ORAL | Status: DC

## 2022-10-07 MED ORDER — ZIPRASIDONE MESYLATE 20 MG IM SOLR
10.0000 mg | Freq: Once | INTRAMUSCULAR | Status: AC
  Administered 2022-10-07: 10 mg via INTRAMUSCULAR
  Filled 2022-10-07: qty 20

## 2022-10-07 MED ORDER — LAMOTRIGINE 100 MG PO TABS
200.0000 mg | ORAL_TABLET | Freq: Every day | ORAL | Status: DC
  Administered 2022-10-07: 200 mg via ORAL
  Filled 2022-10-07 (×3): qty 2

## 2022-10-07 MED ORDER — TRAZODONE HCL 50 MG PO TABS
50.0000 mg | ORAL_TABLET | Freq: Every evening | ORAL | Status: DC | PRN
  Administered 2022-10-07: 50 mg via ORAL
  Filled 2022-10-07 (×2): qty 1
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
## 2022-10-07 NOTE — ED Notes (Signed)
PATIENTS IVC NEEDS A FIRST EXAM

## 2022-10-07 NOTE — Progress Notes (Signed)
Patient has been denied by [facility] due to no appropriate beds available. Patient meets [facility] inpatient criteria per Rockney Ghee, NP. Patient has been faxed out to the following facilities:    [facility]  [address] [place] [place] [address] [phone]  [facility]  [address] [place] [address] [phone]  [facility]  [address], [place] [address] [phone]  [facility]  [address] [place] [address] [phone]  [facility]  [address] [place] [address] [phone]  [facility]  [address] [place] [address] [phone]  [facility]  [address] [place], [place] [address] [phone]  [facility]  [address] [place], [place] [address] [phone]  [facility]  [address] [place] [address] [phone]  [facility]  [address] [place] [address] [phone]  [facility]  [address] [place] [address] [phone]  [facility]  [address], [place] [address] [phone]    Damita Dunnings, MSW, LCSW-A  9:30 PM 10/07/2022
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
## 2022-10-08 LAB — RESP PANEL BY RT-PCR (FLU A&B, COVID) ARPGX2
Influenza A by PCR: NEGATIVE
Influenza B by PCR: NEGATIVE
SARS Coronavirus 2 by RT PCR: NEGATIVE

## 2022-10-08 LAB — RAPID URINE DRUG SCREEN, HOSP PERFORMED
Amphetamines: NOT DETECTED
Barbiturates: NOT DETECTED
Benzodiazepines: POSITIVE — AB
Cocaine: NOT DETECTED
Opiates: NOT DETECTED
Tetrahydrocannabinol: NOT DETECTED

## 2022-10-08 MED ORDER — AMLODIPINE BESYLATE 5 MG PO TABS: 10.0000 mg | ORAL_TABLET | Freq: Every morning | ORAL | Status: DC

## 2022-10-08 MED ORDER — ZIPRASIDONE MESYLATE 20 MG IM SOLR
10.0000 mg | Freq: Once | INTRAMUSCULAR | Status: AC
  Filled 2022-10-08: qty 20

## 2022-10-08 MED ORDER — POLYVINYL ALCOHOL 1.4 % OP SOLN: 1.0000 [drp] | OPHTHALMIC | Status: DC | PRN

## 2022-10-08 MED ORDER — LORAZEPAM 0.5 MG PO TABS
0.5000 mg | ORAL_TABLET | Freq: Two times a day (BID) | ORAL | Status: DC | PRN
  Administered 2022-10-09: 0.5 mg via ORAL
  Filled 2022-10-08 (×4): qty 1

## 2022-10-08 MED ORDER — L-LYSINE 500 MG PO TABS: ORAL_TABLET | Freq: Every day | ORAL | Status: DC

## 2022-10-08 MED ORDER — STERILE WATER FOR INJECTION IJ SOLN
INTRAMUSCULAR | Status: AC
  Administered 2022-10-08: 10 mL
  Filled 2022-10-08: qty 10

## 2022-10-08 MED ORDER — FAMOTIDINE 20 MG PO TABS
20.0000 mg | ORAL_TABLET | Freq: Every day | ORAL | Status: DC
  Filled 2022-10-08 (×2): qty 1

## 2022-10-08 MED ORDER — FAMOTIDINE 20 MG PO TABS: 20.0000 mg | ORAL_TABLET | Freq: Every day | ORAL | Status: DC

## 2022-10-08 MED ORDER — LOSARTAN POTASSIUM 50 MG PO TABS
100.0000 mg | ORAL_TABLET | Freq: Every day | ORAL | Status: DC
  Administered 2022-10-09: 100 mg via ORAL
  Filled 2022-10-08 (×3): qty 2

## 2022-10-08 MED ORDER — AMLODIPINE BESYLATE 5 MG PO TABS
10.0000 mg | ORAL_TABLET | Freq: Every morning | ORAL | Status: DC
  Administered 2022-10-09: 10 mg via ORAL
  Filled 2022-10-08 (×3): qty 2

## 2022-10-08 MED ORDER — ROSUVASTATIN CALCIUM 20 MG PO TABS
20.0000 mg | ORAL_TABLET | Freq: Every day | ORAL | Status: DC
  Filled 2022-10-08 (×3): qty 1

## 2022-10-08 MED ORDER — ZIPRASIDONE MESYLATE 20 MG IM SOLR
INTRAMUSCULAR | Status: AC
  Administered 2022-10-08: 10 mg via INTRAMUSCULAR
  Filled 2022-10-08: qty 20

## 2022-10-08 MED ORDER — MELOXICAM 15 MG PO TABS
15.0000 mg | ORAL_TABLET | ORAL | Status: DC
  Filled 2022-10-08: qty 1

## 2022-10-08 MED ORDER — LORAZEPAM 2 MG/ML IJ SOLN
1.0000 mg | Freq: Once | INTRAMUSCULAR | Status: AC
  Administered 2022-10-08: 1 mg via INTRAMUSCULAR
  Filled 2022-10-08: qty 1

## 2022-10-08 NOTE — Progress Notes (Signed)
[facility] Progress Note  10/08/2022 3:45 PM Brandi Kerr Schryver  MRN:  [id number]   Principal Problem: Delusional disorder [facility]) Diagnosis:  Principal Problem:   Delusional disorder (HCC) Active Problems:   Bipolar disorder [facility](HCC)   ED Assessment Time Calculation: Start Time: 1425 Stop Time: 1440 Total Time in Minutes (Assessment Completion): 15  Subjective: Brandi Kerr Welden is a 61 y.o. female patient admitted with a history of Bipolar Disorder who [facility] and [facility].   HPI: The patient was observed walking in the hallway and later leaning against the nursing station. While partially oriented, she mentioned feeling a bit fuzzy about the reason for her hospital visit. She denies experiencing auditory and visual hallucinations, as well as any suicidal or homicidal ideations. Sleep and appetite remain stable.  On evaluation today, the patient's appearance was casual. Her speech was clear and coherent and a normal rate and volume. Her mood was anxious and depressed with a blunt affect. Her thought process was coherent but her thought content was illogical. There were no signs of delusions or indications that he was currently reacting to internal stimuli. The patient denied experiencing auditory or visual hallucinations. She also denied any current suicidal or homicidal ideations.  Past Psychiatric History: Bipolar Disorder  [place] Scale:  Flowsheet Row ED from 10/07/2022 [facility] from 09/29/2022 in [facility]  C-SSRS RISK CATEGORY No Risk No Risk       Past Medical History:  Past Medical History:  Diagnosis Date   Anxiety    Bipolar disorder (HCC)    Degenerative disc disease at L5-S1 level    Fibromyalgia    GERD (gastroesophageal reflux disease)    Hiatal hernia    High cholesterol    HTN (hypertension)    PTSD (post-traumatic stress disorder)    Sciatica    History reviewed. No  pertinent surgical history. Family History: History reviewed. No pertinent family history.  Social History:  Social History   Substance and Sexual Activity  Alcohol Use Not Currently     Social History   Substance and Sexual Activity  Drug Use Not Currently   Types: Marijuana    Social History   Socioeconomic History   Marital status: Single    Spouse name: Not on file   Number of children: Not on file   Years of education: Not on file   Highest education level: Not on file  Occupational History   Not on file  Tobacco Use   Smoking status: Never   Smokeless tobacco: Never  Substance and Sexual Activity   Alcohol use: Not Currently   Drug use: Not Currently    Types: Marijuana   Sexual activity: Not on file  Other Topics Concern   Not on file  Social History Narrative   Not on file   Social Determinants of Health   Financial Resource Strain: Not on file  Food Insecurity: Not on file  Transportation Needs: Not on file  Physical Activity: Not on file  Stress: Not on file  Social Connections: Not on file    Sleep: Fair  Appetite:  Fair  Current Medications: Current Facility-Administered Medications  Medication Dose Route Frequency Provider Last Rate Last Admin   amLODipine (NORVASC) tablet 10 mg  10 mg Oral q AM Bethann BerkshireZammit, Joseph, MD       famotidine (PEPCID) tablet 20 mg  20 mg Oral QHS Bethann BerkshireZammit, Joseph, MD       lamoTRIgine (  LAMICTAL) tablet 200 mg  200 mg Oral QHS Motley-Mangrum, Jadeka A, PMHNP   200 mg at 10/07/22 2125   LORazepam (ATIVAN) tablet 0.5 mg  0.5 mg Oral BID PRN Bethann Berkshire, MD       losartan (COZAAR) tablet 100 mg  100 mg Oral Daily Bethann Berkshire, MD       meloxicam (MOBIC) tablet 15 mg  15 mg Oral Q MTWThF Bethann Berkshire, MD       polyvinyl alcohol (LIQUIFILM TEARS) 1.4 % ophthalmic solution 1 drop  1 drop Both Eyes PRN Bethann Berkshire, MD       rosuvastatin (CRESTOR) tablet 20 mg  20 mg Oral QHS Bethann Berkshire, MD       traZODone (DESYREL)  tablet 50 mg  50 mg Oral QHS PRN Motley-Mangrum, Jadeka A, PMHNP   50 mg at 10/07/22 2125   Current Outpatient Medications  Medication Sig Dispense Refill   amLODipine (NORVASC) 10 MG tablet Take 10 mg by mouth in the morning.     Emollient (LUBRIDERM SERIOUSLY SENSITIVE) LOTN Apply 1 Application topically 2 (two) times daily.     famotidine (PEPCID) 20 MG tablet Take 20 mg by mouth at bedtime.     L-LYSINE PO Take 1 capsule by mouth daily.     lamoTRIgine (LAMICTAL) 200 MG tablet Take 200 mg by mouth at bedtime.     LORazepam (ATIVAN) 0.5 MG tablet Take 0.5 mg by mouth 2 (two) times daily as needed for anxiety or sleep.     losartan (COZAAR) 100 MG tablet Take 100 mg by mouth daily.     meloxicam (MOBIC) 15 MG tablet Take 15 mg by mouth every Monday, Tuesday, Wednesday, Thursday, and Friday. DO NOT TAKE ON SATURDAYS OR SUNDAYS     REFRESH OPTIVE ADVANCED PF 0.5-1-0.5 % SOLN Place 1 drop into both eyes daily.     rosuvastatin (CRESTOR) 20 MG tablet Take 20 mg by mouth at bedtime.     traZODone (DESYREL) 50 MG tablet Take 1 tablet (50 mg total) by mouth at bedtime as needed for sleep. (Patient taking differently: Take 50 mg by mouth at bedtime.) 14 tablet 0   DULoxetine (CYMBALTA) 60 MG capsule Take 60 mg by mouth daily. (Patient not taking: Reported on 10/07/2022)      Lab Results:  Results for orders placed or performed during the hospital encounter of 10/07/22 (from the past 48 hour(s))  SARS Coronavirus 2 by RT PCR (hospital order, performed in [facility] hospital lab) *cepheid single result test* Anterior Nasal Swab     Status: None   Collection Time: 10/07/22  8:38 AM   Specimen: Anterior Nasal Swab  Result Value Ref Range   SARS Coronavirus 2 by RT PCR NEGATIVE NEGATIVE    Comment: (NOTE) SARS-CoV-2 target nucleic acids are NOT DETECTED.  The SARS-CoV-2 RNA is generally detectable in upper and lower respiratory specimens during the acute phase of infection. The  lowest concentration of SARS-CoV-2 viral copies this assay can detect is 250 copies / mL. A negative result does not preclude SARS-CoV-2 infection and should not be used as the sole basis for treatment or other patient management decisions.  A negative result may occur with improper specimen collection / handling, submission of specimen other than nasopharyngeal swab, presence of viral mutation(s) within the areas targeted by this assay, and inadequate number of viral copies (<250 copies / mL). A negative result must be combined with clinical observations, patient history, and epidemiological information.  Fact  Sheet for Patients:   RoadLapTop.co.za  Fact Sheet for Healthcare Providers: http://kim-miller.com/  This test is not yet approved or  cleared by the [place] FDA and has been authorized for detection and/or diagnosis of SARS-CoV-2 by FDA under an Emergency Use Authorization (EUA).  This EUA will remain in effect (meaning this test can be used) for the duration of the COVID-19 declaration under Section 564(b)(1) of the Act, 21 U.S.C. section 360bbb-3(b)(1), unless the authorization is terminated or revoked sooner.  Performed at [facility], [address], [place], [place] [address]   Comprehensive metabolic panel     Status: Abnormal   Collection Time: 10/07/22  9:32 AM  Result Value Ref Range   Sodium 139 135 - 145 mmol/L   Potassium 3.3 (L) 3.5 - 5.1 mmol/L   Chloride 108 98 - 111 mmol/L   CO2 21 (L) 22 - 32 mmol/L   Glucose, Bld 111 (H) 70 - 99 mg/dL    Comment: Glucose reference range applies only to samples taken after fasting for at least 8 hours.   BUN 25 (H) 8 - 23 mg/dL   Creatinine, Ser 0.07 (H) 0.44 - 1.00 mg/dL   Calcium 9.3 8.9 - 12.1 mg/dL   Total Protein 7.1 6.5 - 8.1 g/dL   Albumin 4.2 3.5 - 5.0 g/dL   AST 20 15 - 41 U/L   ALT 16 0 - 44 U/L   Alkaline Phosphatase 57 38 - 126 U/L    Total Bilirubin 0.7 0.3 - 1.2 mg/dL   GFR, Estimated 54 (L) >60 mL/min    Comment: (NOTE) Calculated using the CKD-EPI Creatinine Equation (2021)    Anion gap 10 5 - 15    Comment: Performed at [facility], [address] [place], [place] [address]  CBC with Differential/Platelet     Status: None   Collection Time: 10/07/22  9:32 AM  Result Value Ref Range   WBC 10.4 4.0 - 10.5 K/uL   RBC 4.67 3.87 - 5.11 MIL/uL   Hemoglobin 13.1 12.0 - 15.0 g/dL   HCT 32.5 49.8 - 26.4 %   MCV 88.7 80.0 - 100.0 fL   MCH 28.1 26.0 - 34.0 pg   MCHC 31.6 30.0 - 36.0 g/dL   RDW 15.8 30.9 - 40.7 %   Platelets 260 150 - 400 K/uL   nRBC 0.0 0.0 - 0.2 %   Neutrophils Relative % 61 %   Neutro Abs 6.2 1.7 - 7.7 K/uL   Lymphocytes Relative 32 %   Lymphs Abs 3.4 0.7 - 4.0 K/uL   Monocytes Relative 7 %   Monocytes Absolute 0.7 0.1 - 1.0 K/uL   Eosinophils Relative 0 %   Eosinophils Absolute 0.0 0.0 - 0.5 K/uL   Basophils Relative 0 %   Basophils Absolute 0.0 0.0 - 0.1 K/uL   Immature Granulocytes 0 %   Abs Immature Granulocytes 0.03 0.00 - 0.07 K/uL    Comment: Performed at [facility], [address] [place], [place] [address]  Ethanol     Status: None   Collection Time: 10/07/22  9:32 AM  Result Value Ref Range   Alcohol, Ethyl (B) <10 <10 mg/dL    Comment: (NOTE) Lowest detectable limit for serum alcohol is 10 mg/dL.  For medical purposes only. Performed at [facility], [address], [place], [place] [address]   Resp Panel by RT-PCR (Flu A&B, Covid) Anterior Nasal Swab     Status:  None   Collection Time: 10/08/22  4:05 AM   Specimen: Anterior Nasal Swab  Result Value Ref Range   SARS Coronavirus 2 by RT PCR NEGATIVE NEGATIVE    Comment: (NOTE) SARS-CoV-2 target nucleic acids are NOT DETECTED.  The SARS-CoV-2 RNA is generally detectable in upper respiratory specimens during the acute phase of infection. The lowest concentration  of SARS-CoV-2 viral copies this assay can detect is 138 copies/mL. A negative result does not preclude SARS-Cov-2 infection and should not be used as the sole basis for treatment or other patient management decisions. A negative result may occur with  improper specimen collection/handling, submission of specimen other than nasopharyngeal swab, presence of viral mutation(s) within the areas targeted by this assay, and inadequate number of viral copies(<138 copies/mL). A negative result must be combined with clinical observations, patient history, and epidemiological information. The expected result is Negative.  Fact Sheet for Patients:  BloggerCourse.com  Fact Sheet for Healthcare Providers:  SeriousBroker.it  This test is no t yet approved or cleared by the [place] FDA and  has been authorized for detection and/or diagnosis of SARS-CoV-2 by FDA under an Emergency Use Authorization (EUA). This EUA will remain  in effect (meaning this test can be used) for the duration of the COVID-19 declaration under Section 564(b)(1) of the Act, 21 U.S.C.section 360bbb-3(b)(1), unless the authorization is terminated  or revoked sooner.       Influenza A by PCR NEGATIVE NEGATIVE   Influenza B by PCR NEGATIVE NEGATIVE    Comment: (NOTE) The Xpert Xpress SARS-CoV-2/FLU/RSV plus assay is intended as an aid in the diagnosis of influenza from Nasopharyngeal swab specimens and should not be used as a sole basis for treatment. Nasal washings and aspirates are unacceptable for Xpert Xpress SARS-CoV-2/FLU/RSV testing.  Fact Sheet for Patients: BloggerCourse.com  Fact Sheet for Healthcare Providers: SeriousBroker.it  This test is not yet approved or cleared by the [place] FDA and has been authorized for detection and/or diagnosis of SARS-CoV-2 by FDA under an Emergency Use Authorization  (EUA). This EUA will remain in effect (meaning this test can be used) for the duration of the COVID-19 declaration under Section 564(b)(1) of the Act, 21 U.S.C. section 360bbb-3(b)(1), unless the authorization is terminated or revoked.  Performed at [facility], [address], [place], [place] [address]   Rapid urine drug screen (hospital performed)     Status: Abnormal   Collection Time: 10/08/22  5:27 AM  Result Value Ref Range   Opiates NONE DETECTED NONE DETECTED   Cocaine NONE DETECTED NONE DETECTED   Benzodiazepines POSITIVE (A) NONE DETECTED   Amphetamines NONE DETECTED NONE DETECTED   Tetrahydrocannabinol NONE DETECTED NONE DETECTED   Barbiturates NONE DETECTED NONE DETECTED    Comment: (NOTE) DRUG SCREEN FOR MEDICAL PURPOSES ONLY.  IF CONFIRMATION IS NEEDED FOR ANY PURPOSE, NOTIFY LAB WITHIN 5 DAYS.  LOWEST DETECTABLE LIMITS FOR URINE DRUG SCREEN Drug Class                     Cutoff (ng/mL) Amphetamine and metabolites    1000 Barbiturate and metabolites    200 Benzodiazepine                 200 Opiates and metabolites        300 Cocaine and metabolites        300 THC  50 Performed at [facility], [address], [place], [place] [address]     Blood Alcohol level:  Lab Results  Component Value Date   ETH <10 10/07/2022   ETH <10 09/29/2022    Physical Findings:  CIWA:    COWS:     Musculoskeletal: Strength & Muscle Tone: within normal limits Gait & Station: normal Patient leans: N/A  Psychiatric Specialty Exam:  Presentation  General Appearance:  Casual  Eye Contact: Fair  Speech: Clear and Coherent; Normal Rate  Speech Volume: Normal  Handedness: Right   Mood and Affect  Mood: Anxious; Depressed  Affect: Blunt   Thought Process  Thought Processes: Coherent  Descriptions of Associations:Intact  Orientation:Partial  Thought Content:Illogical  History of  Schizophrenia/Schizoaffective disorder:No  Duration of Psychotic Symptoms:Less than six months  Hallucinations:Hallucinations: None  Ideas of Reference:Other (comment) (patient provided brief responses)  Suicidal Thoughts:Suicidal Thoughts: No  Homicidal Thoughts:Homicidal Thoughts: No   Sensorium  Memory: Immediate Fair; Recent Fair  Judgment: Impaired  Insight: Fair   Chartered certified accountant: Fair  Attention Span: Fair  Recall: [employer] of Knowledge: Fair  Language: Fair   Psychomotor Activity  Psychomotor Activity: Psychomotor Activity: Normal   Assets  Assets: Desire for Improvement; Financial Resources/Insurance   Sleep  Sleep: Sleep: Fair    Physical Exam: Physical Exam Vitals and nursing note reviewed.  Constitutional:      General: She is not in acute distress.    Appearance: She is not ill-appearing, toxic-appearing or diaphoretic.  Eyes:     General:        Right eye: No discharge.        Left eye: No discharge.  Cardiovascular:     Rate and Rhythm: Normal rate.  Musculoskeletal:        General: Normal range of motion.     Cervical back: Normal range of motion.  Neurological:     Mental Status: She is alert.  Psychiatric:        Thought Content: Thought content does not include homicidal or suicidal ideation.    Review of Systems  Respiratory:  Negative for cough and shortness of breath.   Cardiovascular:  Negative for chest pain.  Gastrointestinal:  Negative for diarrhea, nausea and vomiting.  Psychiatric/Behavioral:  Positive for depression. Negative for suicidal ideas.    Blood pressure (!) 137/96, pulse 99, temperature 98.6 F (37 C), temperature source Oral, resp. rate 18, SpO2 95 %. There is no height or weight on file to calculate BMI.   Medical Decision Making: Patient has been accepted to [facility] for inpatient psychiatric treatment.   Continue home medications while awaiting transfer.      Jackelyn Poling, NP 10/08/2022, 3:45 PM

## 2022-10-08 NOTE — ED Notes (Signed)
Pt's daughter wanded and in rm with pt.

## 2022-10-08 NOTE — ED Notes (Signed)
Daughter Leotis Shames is requesting that the ED psychiatrist speak with pt's primary psychiatrist Dr. Cyril Mourning at phone number [phone].

## 2022-10-08 NOTE — Progress Notes (Signed)
LCSW Progress Note  [id number]   Brandi Kerr  10/08/2022  10:21 AM  Description:   Inpatient Psychiatric Referral  Patient was recommended inpatient per Nira Conn, NP. There are no available beds at [facility] or [facility]. Patient was referred to the following facilities:   Destination Service Provider Address Phone [facility]  [address] [place] [address] [phone]  [facility]  [address] [place] [address] [phone]  [facility]  [address], [place] [address] [phone]  [facility]  [address], [place] [address] [phone]  [facility]  [address], [place] [address] [phone]  [facility]  [address] [place] [address] [phone]  [facility]  [address] [place], [place] [address] [phone]  [facility]  [address] [place], [place] [address] [phone]  [facility]  [address] [place] [address] [phone]  [facility]  [address] [place] [address] [phone]  [facility]  [address] [place] [address] [phone]  [facility]  [address] [place] [address] [phone]      Situation ongoing, CSW to continue following and update chart as more information becomes available.      Cathie Beams, [place]  10/08/2022 10:21 AM

## 2022-10-08 NOTE — Progress Notes (Signed)
CSW requested that pt be reviewed by [facility] for [facility] inpatient behavioral health placement.   Care Team notified: Alona Bene, NP, Nira Conn, NP, [place], LCSWA, Mordecai Rasmussen, MD, Elane Fritz, DO, and Sharolyn Douglas, RN   Maryjean Ka, MSW, [facility] 10/08/2022 9:36 AM

## 2022-10-08 NOTE — Progress Notes (Signed)
Pt was accepted to [facility] TODAY 10/08/2022; Bed assignment: Main Campus  Pt meets inpatient criteria per Alona Bene, PMHNP  Attending Physician will be Estill Cotta, MD  Report can be called to: [phone]  Pt can arrive anytime today  Care Team Notified: Mordecai Rasmussen, MD, Alona Bene, PMHNP, Nira Conn, NP, Sharolyn Douglas, RN, and [address], LCSWA  [place], [place]  10/08/2022 3:30 PM

## 2022-10-08 NOTE — ED Notes (Signed)
Pt's son wanded and currently visiting pt.

## 2022-10-08 NOTE — Progress Notes (Signed)
Patient suppose to be transferred and accepted at [facility] today. Attempted To call the number to give report on the chart but it go straight to voice mail. RN leave contact  on voice mail and will inform to the next RN.

## 2022-10-08 NOTE — ED Provider Notes (Signed)
Emergency Medicine Observation Re-evaluation Note  Mavi Un is a 61 y.o. female, seen on rounds today.  Pt initially presented to the ED for complaints of Psychiatric Evaluation Currently, the patient is awaiting psychiatric bed for manic behavior  Physical Exam  BP (!) 141/95 (BP Location: Right Arm)   Pulse 68   Temp 98.6 F (37 C) (Oral)   Resp 18   SpO2 98%  Physical Exam Alert in no acute distress ED Course / MDM  EKG:EKG Interpretation  Date/Time:  Friday October 08 2022 05:56:33 EST Ventricular Rate:  61 PR Interval:  154 QRS Duration: 90 QT Interval:  450 QTC Calculation: 453 R Axis:   23 Text Interpretation: Normal sinus rhythm Nonspecific T wave abnormality Confirmed by [name] ([id number]) on 10/08/2022 6:03:09 AM  I have reviewed the labs performed to date as well as medications administered while in observation.  Recent changes in the last 24 hours include none.  Plan  Current plan is for psychiatric bed.    Bethann Berkshire, MD 10/08/22 [phone]

## 2022-10-08 NOTE — ED Notes (Signed)
Pt's daughter is requesting for the EDP and case worker to contact her regarding placement in an IP facility in [place]. Leotis Shames (daughter) phone number [phone]. Daughter is asking about pt taking her regular schedule medications. Did advised pt is refusing medications at this time.

## 2022-10-08 NOTE — ED Notes (Signed)
Pt was becoming very agitated. Pt was medicated with Geodon and Ativan. Pt is still yelling, singing loudly and shaking the bed forcefully.

## 2022-10-09 LAB — POTASSIUM: Potassium: 3.7 mmol/L (ref 3.5–5.1)

## 2022-10-09 MED ORDER — POTASSIUM CHLORIDE CRYS ER 20 MEQ PO TBCR
40.0000 meq | EXTENDED_RELEASE_TABLET | Freq: Once | ORAL | Status: AC
  Administered 2022-10-09: 40 meq via ORAL
  Filled 2022-10-09: qty 2

## 2022-10-09 NOTE — ED Notes (Signed)
Left message with sheriff for transportation

## 2022-10-09 NOTE — ED Notes (Signed)
Potassium result WNL. Left callback number for [facility]

## 2022-10-09 NOTE — Consult Note (Signed)
Patient has been accepted at [facility]  and is waiting for transportation by [facility]

## 2022-10-09 NOTE — ED Notes (Signed)
[facility] for report. Left callback number.

## 2022-10-09 NOTE — ED Provider Notes (Signed)
Emergency Medicine Observation Re-evaluation Note  Brandi Kerr is a 61 y.o. female, seen on rounds today.  Pt initially presented to the ED for complaints of Psychiatric Evaluation Currently, the patient is awaiting psychiatric placement.  Physical Exam  BP (!) 149/79 (BP Location: Right Arm)   Pulse 76   Temp 98.1 F (36.7 C) (Oral)   Resp 16   SpO2 94%  Physical Exam Alert with no acute distress ED Course / MDM  EKG:EKG Interpretation  Date/Time:  Friday October 08 2022 05:56:33 EST Ventricular Rate:  61 PR Interval:  154 QRS Duration: 90 QT Interval:  450 QTC Calculation: 453 R Axis:   23 Text Interpretation: Normal sinus rhythm Nonspecific T wave abnormality Confirmed by [name] ([id number]) on 10/08/2022 6:03:09 AM  I have reviewed the labs performed to date as well as medications administered while in observation.  Recent changes in the last 24 hours include none.  Plan  Current plan is for psychiatric admission.    Bethann Berkshire, MD 10/09/22 1356

## 2022-10-09 NOTE — ED Notes (Signed)
Pt awake at 0500. Pt coming out of room and asking what the time is and for breakfast. Pt redirectable to room but repeats the same action after a few minutes. No agression observed.

## 2022-10-09 NOTE — ED Notes (Signed)
Pt asleep at diner time

## 2022-10-09 NOTE — ED Notes (Signed)
Per [facility], pt acceptance on hold pending treatment of low potassium. Requesting medication and lab redraw. Provider informed.

## 2022-10-09 NOTE — ED Notes (Signed)
Visitor at bedside, wanded by security and belongings placed in locker.

## 2022-10-10 NOTE — ED Notes (Signed)
Pt refused to take scheduled blood pressure medication. Pt states, " I don't know if I'm going to take it today." Explained to the pt that she needs to take the medication to prevent her blood pressure from getting to high. Pt then agreed, but then said, "I'm not going to take it right now."

## 2022-10-10 NOTE — ED Notes (Signed)
Pt ambulated to the bathroom without any issues. 

## 2022-10-10 NOTE — ED Notes (Signed)
Assumed care of pt. AAOX4, sitting on the side of the bed. Pt in no apparent distress or pain. Sitter at the bedside. Will continue to monitor.

## 2022-10-10 NOTE — ED Notes (Signed)
Second attempt to call report to Burna Mortimer, Charity fundraiser at [facility].

## 2022-10-10 NOTE — ED Notes (Addendum)
Pt transported by the Sheriff's office to [facility]. Pt AAOx3. Pt signed transfer form, but refused to take scheduled medications prior to the pt leaving. Pt in no apparent distress or pain. Will relay to receiving facility.

## 2022-10-10 NOTE — ED Notes (Signed)
First attempt to call report to [facility].

## 2022-10-10 NOTE — ED Provider Notes (Signed)
Emergency Medicine Observation Re-evaluation Note  Brandi Kerr is a 61 y.o. female, seen on rounds today.  Pt initially presented to the ED for complaints of Psychiatric Evaluation Currently, the patient is resting in no acute distress.  Physical Exam  BP (!) 164/120 (BP Location: Left Arm)   Pulse 95   Temp 98.6 F (37 C) (Oral)   Resp 18   SpO2 97%  Physical Exam General: Appears to be resting comfortably in bed, no acute distress. Cardiac: Regular rate, normal heart rate, non-emergent blood pressure for this morning's vitals. Lungs: No increased work of breathing.  Equal chest rise appreciated Psych: Calm, asleep in bed.   ED Course / MDM  EKG:EKG Interpretation  Date/Time:  Friday October 08 2022 05:56:33 EST Ventricular Rate:  61 PR Interval:  154 QRS Duration: 90 QT Interval:  450 QTC Calculation: 453 R Axis:   23 Text Interpretation: Normal sinus rhythm Nonspecific T wave abnormality Confirmed by [name] ([id number]) on 10/08/2022 6:03:09 AM  I have reviewed the labs performed to date as well as medications administered while in observation. Plan  Current plan is for psychiatric placement.    Glyn Ade, MD 10/10/22 [phone]

## 2022-10-10 NOTE — ED Notes (Signed)
Burna Mortimer, RN from [facility] called back to get report.

## 2023-04-01 ENCOUNTER — Ambulatory Visit: Payer: Medicare HMO | Admitting: Family

## 2023-04-12 ENCOUNTER — Encounter: Payer: Self-pay | Admitting: Family

## 2023-04-12 ENCOUNTER — Ambulatory Visit (INDEPENDENT_AMBULATORY_CARE_PROVIDER_SITE_OTHER): Payer: Medicare HMO | Admitting: Family

## 2023-04-12 VITALS — BP 138/88 | HR 75 | Ht 67.0 in | Wt 228.4 lb

## 2023-04-12 DIAGNOSIS — E7849 Other hyperlipidemia: Secondary | ICD-10-CM

## 2023-04-12 DIAGNOSIS — R300 Dysuria: Secondary | ICD-10-CM | POA: Diagnosis not present

## 2023-04-12 DIAGNOSIS — Z1211 Encounter for screening for malignant neoplasm of colon: Secondary | ICD-10-CM

## 2023-04-12 DIAGNOSIS — M1710 Unilateral primary osteoarthritis, unspecified knee: Secondary | ICD-10-CM

## 2023-04-12 DIAGNOSIS — I10 Essential (primary) hypertension: Secondary | ICD-10-CM | POA: Diagnosis not present

## 2023-04-12 DIAGNOSIS — M19019 Primary osteoarthritis, unspecified shoulder: Secondary | ICD-10-CM | POA: Diagnosis not present

## 2023-04-12 DIAGNOSIS — Z1231 Encounter for screening mammogram for malignant neoplasm of breast: Secondary | ICD-10-CM

## 2023-04-12 MED ORDER — LOSARTAN POTASSIUM 100 MG PO TABS: 100.0000 mg | ORAL_TABLET | Freq: Every day | ORAL | 1 refills | Status: DC

## 2023-04-12 MED ORDER — ROSUVASTATIN CALCIUM 20 MG PO TABS: 20.0000 mg | ORAL_TABLET | Freq: Every day | ORAL | 3 refills | Status: DC

## 2023-04-12 MED ORDER — FAMOTIDINE 20 MG PO TABS: 20.0000 mg | ORAL_TABLET | Freq: Every day | ORAL | 1 refills | Status: DC

## 2023-04-12 MED ORDER — AMLODIPINE BESYLATE 10 MG PO TABS: 10.0000 mg | ORAL_TABLET | Freq: Every day | ORAL | 1 refills | Status: DC

## 2023-04-12 NOTE — Progress Notes (Signed)
Brandi Kerr is a 62 y.o. female with the following history as recorded in Epic[employer]:  Patient Active Problem List   Diagnosis Date Noted   Delusional disorder (HCC) 10/07/2022   Hypertensive urgency 04/27/2018   HTN (hypertension) 04/27/2018   Bipolar disorder (HCC) 04/27/2018   Seizure-like activity (HCC) 04/27/2018    Current Outpatient Medications  Medication Sig Dispense Refill   amLODipine (NORVASC) 10 MG tablet Take 10 mg by mouth in the morning.     amLODipine (NORVASC) 10 MG tablet Take 1 tablet (10 mg total) by mouth daily. 90 tablet 1   Emollient (LUBRIDERM SERIOUSLY SENSITIVE) LOTN Apply 1 Application topically 2 (two) times daily.     famotidine (PEPCID) 20 MG tablet Take 20 mg by mouth at bedtime.     famotidine (PEPCID) 20 MG tablet Take 1 tablet (20 mg total) by mouth at bedtime. 90 tablet 1   L-LYSINE PO Take 1 capsule by mouth daily.     lamoTRIgine (LAMICTAL) 200 MG tablet Take 200 mg by mouth at bedtime.     LORazepam (ATIVAN) 0.5 MG tablet Take 0.5 mg by mouth 2 (two) times daily as needed for anxiety or sleep.     losartan (COZAAR) 100 MG tablet Take 100 mg by mouth daily.     losartan (COZAAR) 100 MG tablet Take 1 tablet (100 mg total) by mouth daily. 90 tablet 1   meloxicam (MOBIC) 15 MG tablet Take 15 mg by mouth every Monday, Tuesday, Wednesday, Thursday, and Friday. DO NOT TAKE ON SATURDAYS OR SUNDAYS     QUEtiapine (SEROQUEL) 200 MG tablet 200 mg.     REFRESH OPTIVE ADVANCED PF 0.5-1-0.5 % SOLN Place 1 drop into both eyes daily.     rosuvastatin (CRESTOR) 20 MG tablet Take 20 mg by mouth at bedtime.     rosuvastatin (CRESTOR) 20 MG tablet Take 1 tablet (20 mg total) by mouth daily. 90 tablet 3   No current facility-administered medications for this visit.    Allergies: Duloxetine hcl, Escitalopram, Tramadol hcl, Atorvastatin calcium, Chocolate, Lithium, Other, Prednisone, Venlafaxine, and Caffeine  Past Medical History:  Diagnosis Date   Anxiety    Bipolar  disorder (HCC)    Degenerative disc disease at L5-S1 level    Fibromyalgia    GERD (gastroesophageal reflux disease)    Hiatal hernia    High cholesterol    HTN (hypertension)    PTSD (post-traumatic stress disorder)    Sciatica     No past surgical history on file.  No family history on file.  Social History   Tobacco Use   Smoking status: Never   Smokeless tobacco: Never  Substance Use Topics   Alcohol use: Not Currently    Subjective:   Presents as a new patient; has recently moved from [place]; requesting refills on maintenance medication; Does need referral to orthopedist; she has already established with dermatology, psychiatrist, therapist and ophthalmologist;  Due for mammogram/ Cologuard; Does get quarterly labs done to monitor kidney/ liver functions;    Objective:  Vitals:   04/12/23 1501 04/12/23 1603  BP: (!) 140/88 138/88  Pulse: 75   SpO2: 98%   Weight: 228 lb 6.4 oz (103.6 kg)   Height: 5\' 7"  (1.702 m)     General: Well developed, well nourished, in no acute distress  Skin : Warm and dry.  Head: Normocephalic and atraumatic  Eyes: Sclera and conjunctiva clear; pupils round and reactive to light; extraocular movements intact  Ears: External normal; canals clear;  tympanic membranes normal  Oropharynx: Pink, supple. No suspicious lesions  Neck: Supple without thyromegaly, adenopathy  Lungs: Respirations unlabored; clear to auscultation bilaterally without wheeze, rales, rhonchi  CVS exam: normal rate and regular rhythm.  Abdomen: Soft; nontender; nondistended; normoactive bowel sounds; no masses or hepatosplenomegaly  Musculoskeletal: No deformities; no active joint inflammation  Extremities: No edema, cyanosis, clubbing  Vessels: Symmetric bilaterally  Neurologic: Alert and oriented; speech intact; face symmetrical; moves all extremities well; CNII-XII intact without focal deficit   Assessment:  1. Primary hypertension   2. Other hyperlipidemia    3. Colon cancer screening   4. Visit for screening mammogram   5. Arthritis of knee   6. Arthritis of shoulder   7. Dysuria     Plan:  Stable; check CBC, CMP; refills updated;  Stable; check lipid panel; refill updated; Order for Cologuard updated; Order for mammogram updated; & 6. Refer to establish with orthopedist; 7.   Check urine culture per patient request;   Follow up in 3-4 months, sooner prn.   No follow-ups on file.  Orders Placed This Encounter  Procedures   Urine Culture   MM Digital Screening    Standing Status:   Future    Standing Expiration Date:   04/11/2024    Order Specific Question:   Reason for Exam (SYMPTOM  OR DIAGNOSIS REQUIRED)    Answer:   screening mammogram    Order Specific Question:   Preferred imaging location?    Answer:   [facility]   Cologuard   CBC with Differential/Platelet   Comp Met (CMET)   Lipid panel   Ambulatory referral to Orthopedics    Referral Priority:   Routine    Referral Type:   Consultation    Number of Visits Requested:   1    Requested Prescriptions   Signed Prescriptions Disp Refills   losartan (COZAAR) 100 MG tablet 90 tablet 1    Sig: Take 1 tablet (100 mg total) by mouth daily.   rosuvastatin (CRESTOR) 20 MG tablet 90 tablet 3    Sig: Take 1 tablet (20 mg total) by mouth daily.   amLODipine (NORVASC) 10 MG tablet 90 tablet 1    Sig: Take 1 tablet (10 mg total) by mouth daily.   famotidine (PEPCID) 20 MG tablet 90 tablet 1    Sig: Take 1 tablet (20 mg total) by mouth at bedtime.

## 2023-04-13 LAB — COMPREHENSIVE METABOLIC PANEL
ALT: 14 U/L (ref 0–35)
AST: 18 U/L (ref 0–37)
Albumin: 4.6 g/dL (ref 3.5–5.2)
Alkaline Phosphatase: 58 U/L (ref 39–117)
BUN: 16 mg/dL (ref 6–23)
CO2: 26 mEq/L (ref 19–32)
Calcium: 9.7 mg/dL (ref 8.4–10.5)
Chloride: 104 mEq/L (ref 96–112)
Creatinine, Ser: 1.02 mg/dL (ref 0.40–1.20)
GFR: 59.07 mL/min — ABNORMAL LOW (ref >60.00)
Glucose, Bld: 101 mg/dL — ABNORMAL HIGH (ref 70–99)
Potassium: 3.9 mEq/L (ref 3.5–5.1)
Sodium: 141 mEq/L (ref 135–145)
Total Bilirubin: 0.5 mg/dL (ref 0.2–1.2)
Total Protein: 7.2 g/dL (ref 6.0–8.3)

## 2023-04-13 LAB — URINE CULTURE
MICRO NUMBER:: 15067920
Result:: NO GROWTH
SPECIMEN QUALITY:: ADEQUATE

## 2023-04-13 LAB — LIPID PANEL
Cholesterol: 201 mg/dL — ABNORMAL HIGH (ref 0–200)
HDL: 64.6 mg/dL (ref >39.00)
LDL Cholesterol: 113 mg/dL — ABNORMAL HIGH (ref 0–99)
NonHDL: 136.66
Total CHOL/HDL Ratio: 3
Triglycerides: 119 mg/dL (ref 0.0–149.0)
VLDL: 23.8 mg/dL (ref 0.0–40.0)

## 2023-04-13 LAB — CBC WITH DIFFERENTIAL/PLATELET
Basophils Absolute: 0.2 10*3/uL — ABNORMAL HIGH (ref 0.0–0.1)
Basophils Relative: 1.5 % (ref 0.0–3.0)
Eosinophils Absolute: 0.1 10*3/uL (ref 0.0–0.7)
Eosinophils Relative: 1.1 % (ref 0.0–5.0)
HCT: 42 % (ref 36.0–46.0)
Hemoglobin: 13.6 g/dL (ref 12.0–15.0)
Lymphocytes Relative: 38.2 % (ref 12.0–46.0)
Lymphs Abs: 4.1 10*3/uL — ABNORMAL HIGH (ref 0.7–4.0)
MCHC: 32.4 g/dL (ref 30.0–36.0)
MCV: 88.6 fl (ref 78.0–100.0)
Monocytes Absolute: 0.7 10*3/uL (ref 0.1–1.0)
Monocytes Relative: 6.6 % (ref 3.0–12.0)
Neutro Abs: 5.6 10*3/uL (ref 1.4–7.7)
Neutrophils Relative %: 52.6 % (ref 43.0–77.0)
Platelets: 314 10*3/uL (ref 150.0–400.0)
RBC: 4.74 Mil/uL (ref 3.87–5.11)
RDW: 13.6 % (ref 11.5–15.5)
WBC: 10.7 10*3/uL — ABNORMAL HIGH (ref 4.0–10.5)

## 2023-04-19 DIAGNOSIS — F419 Anxiety disorder, unspecified: Secondary | ICD-10-CM | POA: Diagnosis not present

## 2023-04-19 DIAGNOSIS — F319 Bipolar disorder, unspecified: Secondary | ICD-10-CM | POA: Diagnosis not present

## 2023-04-26 DIAGNOSIS — F411 Generalized anxiety disorder: Secondary | ICD-10-CM | POA: Diagnosis not present

## 2023-04-26 DIAGNOSIS — F319 Bipolar disorder, unspecified: Secondary | ICD-10-CM | POA: Diagnosis not present

## 2023-04-29 DIAGNOSIS — F319 Bipolar disorder, unspecified: Secondary | ICD-10-CM | POA: Diagnosis not present

## 2023-04-29 DIAGNOSIS — F419 Anxiety disorder, unspecified: Secondary | ICD-10-CM | POA: Diagnosis not present

## 2023-05-03 ENCOUNTER — Telehealth: Payer: Self-pay | Admitting: Family

## 2023-05-03 DIAGNOSIS — F419 Anxiety disorder, unspecified: Secondary | ICD-10-CM | POA: Diagnosis not present

## 2023-05-03 DIAGNOSIS — F319 Bipolar disorder, unspecified: Secondary | ICD-10-CM | POA: Diagnosis not present

## 2023-05-03 NOTE — Telephone Encounter (Signed)
Copied from CRM [id number]. Topic: Medicare AWV >> May 03, 2023 10:20 AM Payton Doughty wrote: Reason for CRM: LM 05/03/2023 to schedule AWV   Verlee Rossetti; Care Guide Ambulatory Clinical Support [place] [facility] Direct Dial: [phone]

## 2023-05-06 ENCOUNTER — Ambulatory Visit (INDEPENDENT_AMBULATORY_CARE_PROVIDER_SITE_OTHER): Payer: Medicare HMO | Admitting: *Deleted

## 2023-05-06 DIAGNOSIS — Z0000 Encounter for general adult medical examination without abnormal findings: Secondary | ICD-10-CM

## 2023-05-06 NOTE — Patient Instructions (Signed)
Brandi Kerr , Thank you for taking time to come for your Medicare Wellness Visit. I appreciate your ongoing commitment to your health goals. Please review the following plan we discussed and let me know if I can assist you in the future.   These are the goals we discussed:  Goals   None     This is a list of the screening recommended for you and due dates:  Health Maintenance  Topic Date Due   Hepatitis C Screening  Never done   Mammogram  Never done   Zoster (Shingles) Vaccine (2 of 2) 01/05/2022   COVID-19 Vaccine (7 - 2023-24 season) 07/02/2022   Flu Shot  06/02/2023   Medicare Annual Wellness Visit  05/05/2024   Cologuard (Stool DNA test)  09/29/2025   DTaP/Tdap/Td vaccine (2 - Td or Tdap) 03/05/2030   HIV Screening  Completed   HPV Vaccine  Aged Out   Pap Smear  Discontinued      Next appointment: Follow up in one year for your annual wellness visit.   Preventive Care 40-64 Years, Female Preventive care refers to lifestyle choices and visits with your health care provider that can promote health and wellness. What does preventive care include? A yearly physical exam. This is also called an annual well check. Dental exams once or twice a year. Routine eye exams. Ask your health care provider how often you should have your eyes checked. Personal lifestyle choices, including: Daily care of your teeth and gums. Regular physical activity. Eating a healthy diet. Avoiding tobacco and drug use. Limiting alcohol use. Practicing safe sex. Taking low-dose aspirin daily starting at age 31. Taking vitamin and mineral supplements as recommended by your health care provider. What happens during an annual well check? The services and screenings done by your health care provider during your annual well check will depend on your age, overall health, lifestyle risk factors, and family history of disease. Counseling  Your health care provider may ask you questions about your: Alcohol  use. Tobacco use. Drug use. Emotional well-being. Home and relationship well-being. Sexual activity. Eating habits. Work and work Astron[employer]. Method of birth control. Menstrual cycle. Pregnancy history. Screening  You may have the following tests or measurements: Height, weight, and BMI. Blood pressure. Lipid and cholesterol levels. These may be checked every 5 years, or more frequently if you are over 28 years old. Skin check. Lung cancer screening. You may have this screening every year starting at age 68 if you have a 30-pack-year history of smoking and currently smoke or have quit within the past 15 years. Fecal occult blood test (FOBT) of the stool. You may have this test every year starting at age 42. Flexible sigmoidoscopy or colonoscopy. You may have a sigmoidoscopy every 5 years or a colonoscopy every 10 years starting at age 77. Hepatitis C blood test. Hepatitis B blood test. Sexually transmitted disease (STD) testing. Diabetes screening. This is done by checking your blood sugar (glucose) after you have not eaten for a while (fasting). You may have this done every 1-3 years. Mammogram. This may be done every 1-2 years. Talk to your health care provider about when you should start having regular mammograms. This may depend on whether you have a family history of breast cancer. BRCA-related cancer screening. This may be done if you have a family history of breast, ovarian, tubal, or peritoneal cancers. Pelvic exam and Pap test. This may be done every 3 years starting at age 57. Starting at  age 56, this may be done every 5 years if you have a Pap test in combination with an HPV test. Bone density scan. This is done to screen for osteoporosis. You may have this scan if you are at high risk for osteoporosis. Discuss your test results, treatment options, and if necessary, the need for more tests with your health care provider. Vaccines  Your health care provider may recommend  certain vaccines, such as: Influenza vaccine. This is recommended every year. Tetanus, diphtheria, and acellular pertussis (Tdap, Td) vaccine. You may need a Td booster every 10 years. Zoster vaccine. You may need this after age 35. Pneumococcal 13-valent conjugate (PCV13) vaccine. You may need this if you have certain conditions and were not previously vaccinated. Pneumococcal polysaccharide (PPSV23) vaccine. You may need one or two doses if you smoke cigarettes or if you have certain conditions. Talk to your health care provider about which screenings and vaccines you need and how often you need them. This information is not intended to replace advice given to you by your health care provider. Make sure you discuss any questions you have with your health care provider. Document Released: 11/14/2015 Document Revised: 07/07/2016 Document Reviewed: 08/19/2015 Elsevier Interactive Patient Education  2017 [employer].    Fall Prevention in the Home Falls can cause injuries. They can happen to people of all ages. There are many things you can do to make your home safe and to help prevent falls. What can I do on the outside of my home? Regularly fix the edges of walkways and driveways and fix any cracks. Remove anything that might make you trip as you walk through a door, such as a raised step or threshold. Trim any bushes or trees on the path to your home. Use bright outdoor lighting. Clear any walking paths of anything that might make someone trip, such as rocks or tools. Regularly check to see if handrails are loose or broken. Make sure that both sides of any steps have handrails. Any raised decks and porches should have guardrails on the edges. Have any leaves, snow, or ice cleared regularly. Use sand or salt on walking paths during winter. Clean up any spills in your garage right away. This includes oil or grease spills. What can I do in the bathroom? Use night lights. Install grab bars  by the toilet and in the tub and shower. Do not use towel bars as grab bars. Use non-skid mats or decals in the tub or shower. If you need to sit down in the shower, use a plastic, non-slip stool. Keep the floor dry. Clean up any water that spills on the floor as soon as it happens. Remove soap buildup in the tub or shower regularly. Attach bath mats securely with double-sided non-slip rug tape. Do not have throw rugs and other things on the floor that can make you trip. What can I do in the bedroom? Use night lights. Make sure that you have a light by your bed that is easy to reach. Do not use any sheets or blankets that are too big for your bed. They should not hang down onto the floor. Have a firm chair that has side arms. You can use this for support while you get dressed. Do not have throw rugs and other things on the floor that can make you trip. What can I do in the kitchen? Clean up any spills right away. Avoid walking on wet floors. Keep items that you use a lot  in easy-to-reach places. If you need to reach something above you, use a strong step stool that has a grab bar. Keep electrical cords out of the way. Do not use floor polish or wax that makes floors slippery. If you must use wax, use non-skid floor wax. Do not have throw rugs and other things on the floor that can make you trip. What can I do with my stairs? Do not leave any items on the stairs. Make sure that there are handrails on both sides of the stairs and use them. Fix handrails that are broken or loose. Make sure that handrails are as long as the stairways. Check any carpeting to make sure that it is firmly attached to the stairs. Fix any carpet that is loose or worn. Avoid having throw rugs at the top or bottom of the stairs. If you do have throw rugs, attach them to the floor with carpet tape. Make sure that you have a light switch at the top of the stairs and the bottom of the stairs. If you do not have them, ask  someone to add them for you. What else can I do to help prevent falls? Wear shoes that: Do not have high heels. Have rubber bottoms. Are comfortable and fit you well. Are closed at the toe. Do not wear sandals. If you use a stepladder: Make sure that it is fully opened. Do not climb a closed stepladder. Make sure that both sides of the stepladder are locked into place. Ask someone to hold it for you, if possible. Clearly mark and make sure that you can see: Any grab bars or handrails. First and last steps. Where the edge of each step is. Use tools that help you move around (mobility aids) if they are needed. These include: Canes. Walkers. Scooters. Crutches. Turn on the lights when you go into a dark area. Replace any light bulbs as soon as they burn out. Set up your furniture so you have a clear path. Avoid moving your furniture around. If any of your floors are uneven, fix them. If there are any pets around you, be aware of where they are. Review your medicines with your doctor. Some medicines can make you feel dizzy. This can increase your chance of falling. Ask your doctor what other things that you can do to help prevent falls. This information is not intended to replace advice given to you by your health care provider. Make sure you discuss any questions you have with your health care provider. Document Released: 08/14/2009 Document Revised: 03/25/2016 Document Reviewed: 11/22/2014 Elsevier Interactive Patient Education  2017 [employer].

## 2023-05-06 NOTE — Progress Notes (Signed)
Subjective:   Brandi Kerr is a 62 y.o. female who presents for an Initial Medicare Annual Wellness Visit.  Visit Complete: Virtual  I connected with  Megan Mans on 05/06/23 by a audio enabled telemedicine application and verified that I am speaking with the correct person using two identifiers.  Patient Location: Home  Provider Location: Office/Clinic  I discussed the limitations of evaluation and management by telemedicine. The patient expressed understanding and agreed to proceed.   Review of Systems     Cardiac Risk Factors include: advanced age (>67men, >76 women);dyslipidemia;hypertension     Objective:    Today's Vitals   There is no height or weight on file to calculate BMI.     05/06/2023    3:11 PM 04/12/2023    3:20 PM 09/29/2022    7:52 PM 04/27/2018    3:31 AM  Advanced Directives  Does Patient Have a Medical Advance Directive? No No No No  Would patient like information on creating a medical advance directive? No - Patient declined Yes (ED - Information included in AVS) No - Patient declined No - Patient declined    Current Medications (verified) Outpatient Encounter Medications as of 05/06/2023  Medication Sig   amLODipine (NORVASC) 10 MG tablet Take 1 tablet (10 mg total) by mouth daily.   Emollient (LUBRIDERM SERIOUSLY SENSITIVE) LOTN Apply 1 Application topically 2 (two) times daily.   famotidine (PEPCID) 20 MG tablet Take 1 tablet (20 mg total) by mouth at bedtime.   L-LYSINE PO Take 1 capsule by mouth daily.   lamoTRIgine (LAMICTAL) 200 MG tablet Take 200 mg by mouth at bedtime.   LORazepam (ATIVAN) 0.5 MG tablet Take 0.5 mg by mouth 2 (two) times daily as needed for anxiety or sleep.   losartan (COZAAR) 100 MG tablet Take 1 tablet (100 mg total) by mouth daily.   meloxicam (MOBIC) 15 MG tablet Take 15 mg by mouth every Monday, Tuesday, Wednesday, Thursday, and Friday. DO NOT TAKE ON SATURDAYS OR SUNDAYS   QUEtiapine (SEROQUEL) 200 MG tablet 200 mg.    REFRESH OPTIVE ADVANCED PF 0.5-1-0.5 % SOLN Place 1 drop into both eyes daily.   rosuvastatin (CRESTOR) 20 MG tablet Take 1 tablet (20 mg total) by mouth daily.   [DISCONTINUED] amLODipine (NORVASC) 10 MG tablet Take 10 mg by mouth in the morning.   [DISCONTINUED] famotidine (PEPCID) 20 MG tablet Take 20 mg by mouth at bedtime.   [DISCONTINUED] losartan (COZAAR) 100 MG tablet Take 100 mg by mouth daily.   [DISCONTINUED] rosuvastatin (CRESTOR) 20 MG tablet Take 20 mg by mouth at bedtime.   No facility-administered encounter medications on file as of 05/06/2023.    Allergies (verified) Duloxetine hcl, Escitalopram, Tramadol hcl, Atorvastatin calcium, Chocolate, Lithium, Other, Prednisone, Venlafaxine, and Caffeine   History: Past Medical History:  Diagnosis Date   Anxiety    Bipolar disorder (HCC)    Degenerative disc disease at L5-S1 level    Fibromyalgia    GERD (gastroesophageal reflux disease)    Hiatal hernia    High cholesterol    HTN (hypertension)    PTSD (post-traumatic stress disorder)    Sciatica    History reviewed. No pertinent surgical history. History reviewed. No pertinent family history. Social History   Socioeconomic History   Marital status: Divorced    Spouse name: Not on file   Number of children: Not on file   Years of education: Not on file   Highest education level: Not on file  Occupational  History   Not on file  Tobacco Use   Smoking status: Never   Smokeless tobacco: Never  Substance and Sexual Activity   Alcohol use: Not Currently   Drug use: Not Currently    Types: Marijuana   Sexual activity: Not on file  Other Topics Concern   Not on file  Social History Narrative   Not on file   Social Determinants of Health   Financial Resource Strain: Low Risk  (05/06/2023)   Overall Financial Resource Strain (CARDIA)    Difficulty of Paying Living Expenses: Not hard at all  Food Insecurity: No Food Insecurity (05/06/2023)   Hunger Vital Sign     Worried About Running Out of Food in the Last Year: Never true    Ran Out of Food in the Last Year: Never true  Transportation Needs: No Transportation Needs (05/06/2023)   PRAPARE - Administrator, Civil Service (Medical): No    Lack of Transportation (Non-Medical): No  Physical Activity: Insufficiently Active (05/06/2023)   Exercise Vital Sign    Days of Exercise per Week: 1 day    Minutes of Exercise per Session: 20 min  Stress: No Stress Concern Present (05/06/2023)   [employer] of Occupational Health - Occupational Stress Questionnaire    Feeling of Stress : Only a little  Social Connections: Socially Isolated (05/06/2023)   Social Connection and Isolation Panel [NHANES]    Frequency of Communication with Friends and Family: More than three times a week    Frequency of Social Gatherings with Friends and Family: Once a week    Attends Religious Services: Never    Database administrator or Organizations: No    Attends Engineer, structural: Never    Marital Status: Divorced    Tobacco Counseling Counseling given: Not Answered   Clinical Intake:  Pre-visit preparation completed: Yes  Pain : No/denies pain  Nutritional Risks: None Diabetes: No  How often do you need to have someone help you when you read instructions, pamphlets, or other written materials from your doctor or pharmacy?: 1 - Never  Interpreter Needed?: No  Information entered by :: [employer], CMA   Activities of Daily Living    05/06/2023    3:04 PM  In your present state of health, do you have any difficulty performing the following activities:  Hearing? 0  Vision? 0  Difficulty concentrating or making decisions? 0  Walking or climbing stairs? 1  Dressing or bathing? 0  Doing errands, shopping? 0  Preparing Food and eating ? N  Using the Toilet? N  In the past six months, have you accidently leaked urine? N  Do you have problems with loss of bowel control? N  Managing your  Medications? N  Managing your Finances? N  Housekeeping or managing your Housekeeping? N    Patient Care Team: Olive Bass, FNP as PCP - General (Internal Medicine)  Indicate any recent Medical Services you may have received from other than [facility] providers in the past year (date may be approximate).     Assessment:   This is a routine wellness examination for [employer].  Hearing/Vision screen No results found.  Dietary issues and exercise activities discussed:     Goals Addressed   None    Depression Screen    05/06/2023    3:10 PM 04/12/2023    3:07 PM  PHQ 2/9 Scores  PHQ - 2 Score 0 0    Fall Risk  05/06/2023    3:06 PM 04/12/2023    3:07 PM  Fall Risk   Falls in the past year? 0 0  Number falls in past yr: 0 0  Injury with Fall? 0 0  Risk for fall due to : No Fall Risks No Fall Risks  Follow up Falls evaluation completed Falls evaluation completed    MEDICARE RISK AT HOME:  Medicare Risk at Home - 05/06/23 1505     Any stairs in or around the home? No    If so, are there any without handrails? No    Home free of loose throw rugs in walkways, pet beds, electrical cords, etc? Yes    Adequate lighting in your home to reduce risk of falls? Yes    Life alert? No    Use of a cane, walker or w/c? No    Grab bars in the bathroom? No    Shower chair or bench in shower? No    Elevated toilet seat or a handicapped toilet? No             TIMED UP AND GO:  Was the test performed? No    Cognitive Function:        05/06/2023    3:12 PM  6CIT Screen  What Year? 0 points  What month? 0 points  What time? 0 points  Count back from 20 0 points  Months in reverse 0 points  Repeat phrase 0 points  Total Score 0 points    Immunizations Immunization History  Administered Date(s) Administered   Influenza, Quadrivalent, Recombinant, Inj, Pf 09/13/2017, 09/18/2018, 08/03/2019, 08/01/2020, 08/04/2021   PFIZER Comirnaty(Gray Top)Covid-19 Tri-Sucrose  Vaccine 04/10/2021   PFIZER(Purple Top)SARS-COV-2 Vaccination 12/28/2019, 10/06/2020   Pfizer Covid-19 Vaccine Bivalent Booster 88yrs & up 09/17/2021   Tdap 03/05/2020   Zoster Recombinant(Shingrix) 11/10/2021    TDAP status: Up to date  Flu Vaccine status: Up to date  Pneumococcal vaccine status: Due, Education has been provided regarding the importance of this vaccine. Advised may receive this vaccine at local pharmacy or Health Dept. Aware to provide a copy of the vaccination record if obtained from local pharmacy or Health Dept. Verbalized acceptance and understanding.  Covid-19 vaccine status: Information provided on how to obtain vaccines.   Qualifies for Shingles Vaccine? Yes   Zostavax completed No   Shingrix Completed?: No.    Education has been provided regarding the importance of this vaccine. Patient has been advised to call insurance company to determine out of pocket expense if they have not yet received this vaccine. Advised may also receive vaccine at local pharmacy or Health Dept. Verbalized acceptance and understanding.  Screening Tests Health Maintenance  Topic Date Due   Medicare Annual Wellness (AWV)  Never done   Hepatitis C Screening  Never done   MAMMOGRAM  Never done   Zoster Vaccines- Shingrix (2 of 2) 01/05/2022   COVID-19 Vaccine (7 - 2023-24 season) 07/02/2022   INFLUENZA VACCINE  06/02/2023   Fecal DNA (Cologuard)  09/29/2025   DTaP/Tdap/Td (2 - Td or Tdap) 03/05/2030   HIV Screening  Completed   HPV VACCINES  Aged Out   PAP SMEAR-Modifier  Discontinued    Health Maintenance  Health Maintenance Due  Topic Date Due   Medicare Annual Wellness (AWV)  Never done   Hepatitis C Screening  Never done   MAMMOGRAM  Never done   Zoster Vaccines- Shingrix (2 of 2) 01/05/2022   COVID-19 Vaccine (7 - 2023-24 season) 07/02/2022  Colorectal cancer screening: Type of screening: Cologuard. Completed 09/29/22. Repeat every 3 years  Mammogram status:  Ordered 04/12/23. Pt provided with contact info and advised to call to schedule appt.   Bone Density status: not due til age 37  Lung Cancer Screening: (Low Dose CT Chest recommended if Age 37-80 years, 20 pack-year currently smoking OR have quit w/in 15years.) does not qualify.   Additional Screening:  Hepatitis C Screening: does qualify; Completed N/a  Vision Screening: Recommended annual ophthalmology exams for early detection of glaucoma and other disorders of the eye. Is the patient up to date with their annual eye exam?  Yes  Who is the provider or what is the name of the office in which the patient attends annual eye exams? Establishing with new retina specialist in [place] (just moved here from [place]) If pt is not established with a provider, would they like to be referred to a provider to establish care? No .   Dental Screening: Recommended annual dental exams for proper oral hygiene  Diabetic Foot Exam: N/a  Community Resource Referral / Chronic Care Management: CRR required this visit?  No   CCM required this visit?  No     Plan:     I have personally reviewed and noted the following in the patient's chart:   Medical and social history Use of alcohol, tobacco or illicit drugs  Current medications and supplements including opioid prescriptions. Patient is not currently taking opioid prescriptions. Functional ability and status Nutritional status Physical activity Advanced directives List of other physicians Hospitalizations, surgeries, and ER visits in previous 12 months Vitals Screenings to include cognitive, depression, and falls Referrals and appointments  In addition, I have reviewed and discussed with patient certain preventive protocols, quality metrics, and best practice recommendations. A written personalized care plan for preventive services as well as general preventive health recommendations were provided to patient.     Donne Anon,  CMA   05/06/2023   After Visit Summary: (MyChart) Due to this being a telephonic visit, the after visit summary with patients personalized plan was offered to patient via MyChart   Nurse Notes: None

## 2023-05-13 DIAGNOSIS — Z1211 Encounter for screening for malignant neoplasm of colon: Secondary | ICD-10-CM | POA: Diagnosis not present

## 2023-05-16 DIAGNOSIS — F319 Bipolar disorder, unspecified: Secondary | ICD-10-CM | POA: Diagnosis not present

## 2023-05-16 DIAGNOSIS — F419 Anxiety disorder, unspecified: Secondary | ICD-10-CM | POA: Diagnosis not present

## 2023-05-17 ENCOUNTER — Encounter (HOSPITAL_BASED_OUTPATIENT_CLINIC_OR_DEPARTMENT_OTHER): Payer: Self-pay

## 2023-05-17 ENCOUNTER — Ambulatory Visit (HOSPITAL_BASED_OUTPATIENT_CLINIC_OR_DEPARTMENT_OTHER)
Admission: RE | Admit: 2023-05-17 | Discharge: 2023-05-17 | Disposition: A | Discharge status: Home / Self Care | Payer: Medicare HMO | Source: Ambulatory Visit | Attending: Family | Admitting: Family

## 2023-05-17 DIAGNOSIS — Z1231 Encounter for screening mammogram for malignant neoplasm of breast: Secondary | ICD-10-CM | POA: Insufficient documentation

## 2023-05-19 DIAGNOSIS — H33312 Horseshoe tear of retina without detachment, left eye: Secondary | ICD-10-CM | POA: Diagnosis not present

## 2023-05-19 DIAGNOSIS — H43391 Other vitreous opacities, right eye: Secondary | ICD-10-CM | POA: Diagnosis not present

## 2023-05-19 DIAGNOSIS — H31093 Other chorioretinal scars, bilateral: Secondary | ICD-10-CM | POA: Diagnosis not present

## 2023-05-19 DIAGNOSIS — H43812 Vitreous degeneration, left eye: Secondary | ICD-10-CM | POA: Diagnosis not present

## 2023-05-19 LAB — COLOGUARD: COLOGUARD: NEGATIVE

## 2023-05-22 ENCOUNTER — Encounter: Payer: Self-pay | Admitting: Family

## 2023-05-23 ENCOUNTER — Other Ambulatory Visit: Payer: Self-pay | Admitting: Family

## 2023-05-23 DIAGNOSIS — L538 Other specified erythematous conditions: Secondary | ICD-10-CM | POA: Diagnosis not present

## 2023-05-23 DIAGNOSIS — D225 Melanocytic nevi of trunk: Secondary | ICD-10-CM | POA: Diagnosis not present

## 2023-05-23 DIAGNOSIS — L298 Other pruritus: Secondary | ICD-10-CM | POA: Diagnosis not present

## 2023-05-23 DIAGNOSIS — L820 Inflamed seborrheic keratosis: Secondary | ICD-10-CM | POA: Diagnosis not present

## 2023-05-23 DIAGNOSIS — F319 Bipolar disorder, unspecified: Secondary | ICD-10-CM | POA: Diagnosis not present

## 2023-05-23 DIAGNOSIS — R208 Other disturbances of skin sensation: Secondary | ICD-10-CM | POA: Diagnosis not present

## 2023-05-23 DIAGNOSIS — R112 Nausea with vomiting, unspecified: Secondary | ICD-10-CM

## 2023-05-23 DIAGNOSIS — Z789 Other specified health status: Secondary | ICD-10-CM | POA: Diagnosis not present

## 2023-05-23 DIAGNOSIS — L814 Other melanin hyperpigmentation: Secondary | ICD-10-CM | POA: Diagnosis not present

## 2023-05-23 DIAGNOSIS — F419 Anxiety disorder, unspecified: Secondary | ICD-10-CM | POA: Diagnosis not present

## 2023-05-23 DIAGNOSIS — L821 Other seborrheic keratosis: Secondary | ICD-10-CM | POA: Diagnosis not present

## 2023-05-25 ENCOUNTER — Encounter: Payer: Self-pay | Admitting: Physician Assistant

## 2023-05-26 DIAGNOSIS — F319 Bipolar disorder, unspecified: Secondary | ICD-10-CM | POA: Diagnosis not present

## 2023-05-26 DIAGNOSIS — F419 Anxiety disorder, unspecified: Secondary | ICD-10-CM | POA: Diagnosis not present

## 2023-05-30 ENCOUNTER — Other Ambulatory Visit: Payer: Self-pay

## 2023-05-30 ENCOUNTER — Other Ambulatory Visit (INDEPENDENT_AMBULATORY_CARE_PROVIDER_SITE_OTHER): Payer: Medicare HMO

## 2023-05-30 ENCOUNTER — Ambulatory Visit: Payer: Medicare HMO | Admitting: Orthopedic Surgery

## 2023-05-30 DIAGNOSIS — M25511 Pain in right shoulder: Secondary | ICD-10-CM

## 2023-05-30 DIAGNOSIS — M25561 Pain in right knee: Secondary | ICD-10-CM

## 2023-05-30 DIAGNOSIS — M1711 Unilateral primary osteoarthritis, right knee: Secondary | ICD-10-CM

## 2023-05-30 DIAGNOSIS — M25512 Pain in left shoulder: Secondary | ICD-10-CM

## 2023-05-30 DIAGNOSIS — M25562 Pain in left knee: Secondary | ICD-10-CM

## 2023-05-30 DIAGNOSIS — Z7689 Persons encountering health services in other specified circumstances: Secondary | ICD-10-CM

## 2023-05-30 DIAGNOSIS — M1712 Unilateral primary osteoarthritis, left knee: Secondary | ICD-10-CM

## 2023-05-31 ENCOUNTER — Encounter: Payer: Self-pay | Admitting: Orthopedic Surgery

## 2023-05-31 NOTE — Progress Notes (Signed)
Office Visit Note   Patient: Brandi Kerr           Date of Birth: 27-Dec-1960           MRN: [id number] Visit Date: 05/30/2023 Requested by: Olive Bass, FNP [address] [place],  [place] [address] PCP: Olive Bass, FNP  Subjective: Chief Complaint  Patient presents with   Right Knee - Pain   Left Shoulder - Pain   Right Shoulder - Pain   Left Knee - Pain    HPI: Brandi Kerr is a 62 y.o. 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  She recently moved from [place].  Wants to establish care here for knee pain and shoulder pain.  She does report bilateral knee pain left slightly worse than right.  Last time knees were injected was about 6 or 7 months ago.  They typically help a lot.  Minimal pain overall currently.  No prior surgeries on the knees.  She states that she was in bad shape before starting the cortisone regimen.  Patient also reports bilateral shoulder pain but it actually stopped about 2 years ago.  Patient takes Mobic as needed.  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  She has started walking about 20 to 30 minutes at a time.  She states her standing endurance is "not great"..                ROS: All systems reviewed are negative as they relate to the chief complaint within the history of present illness.  Patient denies fevers or chills.  Assessment & Plan: Visit Diagnoses:  1. Encounter to establish care     Plan: Impression is bilateral knee arthritis left worse than right.  She also has mild arthritis in the shoulder.  Slight flexion contracture in the left knee.  Currently she is doing Psychologist, clinical well.  Plan is for episodic steroid injections as needed.  We can do those both in the knees and the shoulders.  She will follow-up with [place] as needed.  I did encourage her to intermix some nonweightbearing quad strengthening exercises with her  walking in order to diminish the wear and tear on the left knee especially.  Follow-Up Instructions: No follow-ups on file.   Orders:  Orders Placed This Encounter  Procedures   XR KNEE 3 VIEW RIGHT   XR Knee 1-2 Views Left   XR Shoulder Right   XR Shoulder Left   No orders of the defined types were placed in this encounter.     Procedures: No procedures performed   Clinical Data: No additional findings.  Objective: Vital Signs: There were no vitals taken for this visit.  Physical Exam:  Constitutional: Patient appears well-developed HEENT:  Head: Normocephalic Eyes:EOM are normal Neck: Normal range of motion Cardiovascular: Normal rate Pulmonary/chest: Effort normal Neurologic: Patient is alert Skin: Skin is warm Psychiatric: Patient has normal mood and affect  Ortho Exam: Ortho examination of the knees demonstrates no effusion.  Range of motion on the left is 5-1 15 on the right is 0-1 20.  Collateral and cruciate ligaments are stable in both knees.  Pedal pulses palpable.  No groin pain with internal or external rotation of either leg.  No other masses lymphadenopathy or skin changes noted in either knee region.  Bilateral shoulder exam demonstrates passive range of motion of 75/100/175.  She  does have a little coarseness to passive range of motion on the left compared to the right.  Rotator cuff strength is intact infraspinatus supraspinatus and subscap muscle testing.  Specialty Comments:  No specialty comments available.  Imaging: No results found.   PMFS History: Patient Active Problem List   Diagnosis Date Noted   Delusional disorder (HCC) 10/07/2022   Bipolar affective disorder, currently depressed, moderate (HCC) 11/18/2021   Gastroesophageal reflux disease 11/18/2021   Hiatal hernia 11/18/2021   Primary osteoarthritis 11/18/2021   Fibromyalgia 11/18/2021   Hypertensive urgency 04/27/2018   HTN (hypertension) 04/27/2018   Bipolar disorder (HCC)  04/27/2018   Seizure-like activity (HCC) 04/27/2018   Past Medical History:  Diagnosis Date   Anxiety    Bipolar disorder (HCC)    Degenerative disc disease at L5-S1 level    Fibromyalgia    GERD (gastroesophageal reflux disease)    Hiatal hernia    High cholesterol    HTN (hypertension)    PTSD (post-traumatic stress disorder)    Sciatica     No family history on file.  No past surgical history on file. Social History   Occupational History   Not on file  Tobacco Use   Smoking status: Never   Smokeless tobacco: Never  Substance and Sexual Activity   Alcohol use: Not Currently   Drug use: Not Currently    Types: Marijuana   Sexual activity: Not on file

## 2023-06-01 DIAGNOSIS — F319 Bipolar disorder, unspecified: Secondary | ICD-10-CM | POA: Diagnosis not present

## 2023-06-01 DIAGNOSIS — F419 Anxiety disorder, unspecified: Secondary | ICD-10-CM | POA: Diagnosis not present

## 2023-06-07 ENCOUNTER — Ambulatory Visit (INDEPENDENT_AMBULATORY_CARE_PROVIDER_SITE_OTHER): Payer: Medicare HMO | Admitting: Family

## 2023-06-07 ENCOUNTER — Encounter: Payer: Self-pay | Admitting: Family

## 2023-06-07 VITALS — BP 130/82 | HR 92 | Ht 67.0 in | Wt 226.4 lb

## 2023-06-07 DIAGNOSIS — R7309 Other abnormal glucose: Secondary | ICD-10-CM | POA: Diagnosis not present

## 2023-06-07 DIAGNOSIS — Z1159 Encounter for screening for other viral diseases: Secondary | ICD-10-CM

## 2023-06-07 DIAGNOSIS — R109 Unspecified abdominal pain: Secondary | ICD-10-CM

## 2023-06-07 NOTE — Progress Notes (Signed)
Brandi Kerr is a 62 y.o. female with the following history as recorded in EpicCare:  Patient Active Problem List   Diagnosis Date Noted   Delusional disorder (HCC) 10/07/2022   Bipolar affective disorder, currently depressed, moderate (HCC) 11/18/2021   Gastroesophageal reflux disease 11/18/2021   Hiatal hernia 11/18/2021   Primary osteoarthritis 11/18/2021   Fibromyalgia 11/18/2021   Hypertensive urgency 04/27/2018   HTN (hypertension) 04/27/2018   Bipolar disorder (HCC) 04/27/2018   Seizure-like activity (HCC) 04/27/2018    Current Outpatient Medications  Medication Sig Dispense Refill   amLODipine (NORVASC) 10 MG tablet Take 1 tablet (10 mg total) by mouth daily. 90 tablet 1   Emollient (LUBRIDERM SERIOUSLY SENSITIVE) LOTN Apply 1 Application topically 2 (two) times daily.     famotidine (PEPCID) 20 MG tablet Take 1 tablet (20 mg total) by mouth at bedtime. 90 tablet 1   L-LYSINE PO Take 1 capsule by mouth daily.     lamoTRIgine (LAMICTAL) 200 MG tablet Take 200 mg by mouth at bedtime.     LORazepam (ATIVAN) 0.5 MG tablet Take 0.5 mg by mouth 2 (two) times daily as needed for anxiety or sleep.     losartan (COZAAR) 100 MG tablet Take 1 tablet (100 mg total) by mouth daily. 90 tablet 1   meloxicam (MOBIC) 15 MG tablet Take 15 mg by mouth every Monday, Tuesday, Wednesday, Thursday, and Friday. DO NOT TAKE ON SATURDAYS OR SUNDAYS     QUEtiapine (SEROQUEL) 200 MG tablet 200 mg.     REFRESH OPTIVE ADVANCED PF 0.5-1-0.5 % SOLN Place 1 drop into both eyes daily.     rosuvastatin (CRESTOR) 20 MG tablet Take 1 tablet (20 mg total) by mouth daily. 90 tablet 3   No current facility-administered medications for this visit.    Allergies: Duloxetine hcl, Escitalopram, Tramadol hcl, Atorvastatin calcium, Chocolate, Lithium, Other, Prednisone, Venlafaxine, and Caffeine  Past Medical History:  Diagnosis Date   Anxiety    Bipolar disorder (HCC)    Degenerative disc disease at L5-S1 level     Fibromyalgia    GERD (gastroesophageal reflux disease)    Hiatal hernia    High cholesterol    HTN (hypertension)    PTSD (post-traumatic stress disorder)    Sciatica     No past surgical history on file.  No family history on file.  Social History   Tobacco Use   Smoking status: Never   Smokeless tobacco: Never  Substance Use Topics   Alcohol use: Not Currently    Subjective:   Patient has been having intermittent RUQ pain "on and off" for 2 months- no specific trigger; gallbladder has been removed; did take laxative on Sunday and that seemed to help; normal bowel movements; will be seeing GI in October with concerns about hiatal hernia;   Also notes that for the past 10 years has had intermittent episodes of feeling faint when standing for extended periods of time; has seen cardiology in the past and was told that no medication was warranted; notes that symptoms most noticeable with walking/ shopping; wants it documented that has had 2 episodes in the past 2 weeks but does not want to pursue any follow up at this time.    Objective:  Vitals:   06/07/23 1256  BP: 130/82  Pulse: 92  SpO2: 98%  Weight: 226 lb 6.4 oz (102.7 kg)  Height: 5\' 7"  (1.702 m)    General: Well developed, well nourished, in no acute distress  Skin : Warm  and dry.  Head: Normocephalic and atraumatic  Eyes: Sclera and conjunctiva clear; pupils round and reactive to light; extraocular movements intact  Ears: External normal; canals clear; tympanic membranes normal  Oropharynx: Pink, supple. No suspicious lesions  Neck: Supple without thyromegaly, adenopathy  Lungs: Respirations unlabored;  Abdomen: Soft; nontender; nondistended; normoactive bowel sounds; no masses or hepatosplenomegaly  Musculoskeletal: No deformities; no active joint inflammation  Extremities: No edema, cyanosis, clubbing  Vessels: Symmetric bilaterally  Neurologic: Alert and oriented; speech intact; face symmetrical; moves all  extremities well; CNII-XII intact without focal deficit   Assessment:  1. Abdominal pain, unspecified abdominal location   2. Need for hepatitis C screening test   3. Elevated glucose     Plan:  Check CMP; order updated for abdominal/ pelvic CT;  Check Hep C per patient request; Check Hgba1c;   No follow-ups on file.  Orders Placed This Encounter  Procedures   CT ABDOMEN PELVIS W CONTRAST    Standing Status:   Future    Standing Expiration Date:   06/06/2024    Order Specific Question:   If indicated for the ordered procedure, I authorize the administration of contrast media per Radiology protocol    Answer:   Yes    Order Specific Question:   Does the patient have a contrast media/X-ray dye allergy?    Answer:   No    Order Specific Question:   Preferred imaging location?    Answer:   [facility] Specific Question:   If indicated for the ordered procedure, I authorize the administration of oral contrast media per Radiology protocol    Answer:   Yes   Hepatitis C Antibody   Hemoglobin A1c   Comp Met (CMET)    Requested Prescriptions    No prescriptions requested or ordered in this encounter

## 2023-06-08 DIAGNOSIS — F319 Bipolar disorder, unspecified: Secondary | ICD-10-CM | POA: Diagnosis not present

## 2023-06-08 DIAGNOSIS — F411 Generalized anxiety disorder: Secondary | ICD-10-CM | POA: Diagnosis not present

## 2023-06-09 ENCOUNTER — Telehealth: Payer: Self-pay | Admitting: Family

## 2023-06-09 NOTE — Telephone Encounter (Signed)
Pt states she is aware the CT is not enclosed, however pt has a fear of "anything being over her while laying flat". Pt states she still needs a medication to get her through it.

## 2023-06-09 NOTE — Telephone Encounter (Signed)
Pt called and stated that she's having a CT done at the imaging dept. Pt stated she is claustrophobic & wanted to know If Vernona Rieger could prescribe her with something that could help calm her. Please advise pt.

## 2023-06-13 DIAGNOSIS — H52223 Regular astigmatism, bilateral: Secondary | ICD-10-CM | POA: Diagnosis not present

## 2023-06-13 DIAGNOSIS — H2513 Age-related nuclear cataract, bilateral: Secondary | ICD-10-CM | POA: Diagnosis not present

## 2023-06-13 DIAGNOSIS — H524 Presbyopia: Secondary | ICD-10-CM | POA: Diagnosis not present

## 2023-06-13 DIAGNOSIS — H5213 Myopia, bilateral: Secondary | ICD-10-CM | POA: Diagnosis not present

## 2023-06-13 NOTE — Telephone Encounter (Signed)
Spoke with pt, pt is aware and expressed understanding, pt states she will take the Ativan.

## 2023-06-16 ENCOUNTER — Encounter (INDEPENDENT_AMBULATORY_CARE_PROVIDER_SITE_OTHER): Payer: Self-pay

## 2023-06-16 ENCOUNTER — Ambulatory Visit (HOSPITAL_BASED_OUTPATIENT_CLINIC_OR_DEPARTMENT_OTHER)
Admission: RE | Admit: 2023-06-16 | Discharge: 2023-06-16 | Disposition: A | Discharge status: Home / Self Care | Payer: Medicare HMO | Source: Ambulatory Visit | Attending: Family | Admitting: Family

## 2023-06-16 DIAGNOSIS — R109 Unspecified abdominal pain: Secondary | ICD-10-CM | POA: Insufficient documentation

## 2023-06-16 DIAGNOSIS — N281 Cyst of kidney, acquired: Secondary | ICD-10-CM | POA: Diagnosis not present

## 2023-06-16 DIAGNOSIS — K5730 Diverticulosis of large intestine without perforation or abscess without bleeding: Secondary | ICD-10-CM | POA: Diagnosis not present

## 2023-06-16 DIAGNOSIS — K449 Diaphragmatic hernia without obstruction or gangrene: Secondary | ICD-10-CM | POA: Diagnosis not present

## 2023-06-16 MED ORDER — IOHEXOL 300 MG/ML  SOLN
100.0000 mL | Freq: Once | INTRAMUSCULAR | Status: AC | PRN
  Administered 2023-06-16: 100 mL via INTRAVENOUS

## 2023-06-16 MED ORDER — IOHEXOL 350 MG/ML SOLN: 100.0000 mL | Freq: Once | INTRAVENOUS | Status: DC | PRN

## 2023-06-29 ENCOUNTER — Encounter: Payer: Self-pay | Admitting: Family

## 2023-06-29 DIAGNOSIS — F419 Anxiety disorder, unspecified: Secondary | ICD-10-CM | POA: Diagnosis not present

## 2023-06-29 DIAGNOSIS — F319 Bipolar disorder, unspecified: Secondary | ICD-10-CM | POA: Diagnosis not present

## 2023-06-30 DIAGNOSIS — L538 Other specified erythematous conditions: Secondary | ICD-10-CM | POA: Diagnosis not present

## 2023-06-30 DIAGNOSIS — L820 Inflamed seborrheic keratosis: Secondary | ICD-10-CM | POA: Diagnosis not present

## 2023-06-30 DIAGNOSIS — R208 Other disturbances of skin sensation: Secondary | ICD-10-CM | POA: Diagnosis not present

## 2023-06-30 DIAGNOSIS — Z789 Other specified health status: Secondary | ICD-10-CM | POA: Diagnosis not present

## 2023-06-30 DIAGNOSIS — L298 Other pruritus: Secondary | ICD-10-CM | POA: Diagnosis not present

## 2023-07-06 DIAGNOSIS — F319 Bipolar disorder, unspecified: Secondary | ICD-10-CM | POA: Diagnosis not present

## 2023-07-06 DIAGNOSIS — F419 Anxiety disorder, unspecified: Secondary | ICD-10-CM | POA: Diagnosis not present

## 2023-07-07 DIAGNOSIS — H33312 Horseshoe tear of retina without detachment, left eye: Secondary | ICD-10-CM | POA: Diagnosis not present

## 2023-07-07 DIAGNOSIS — H31093 Other chorioretinal scars, bilateral: Secondary | ICD-10-CM | POA: Diagnosis not present

## 2023-07-07 DIAGNOSIS — H43391 Other vitreous opacities, right eye: Secondary | ICD-10-CM | POA: Diagnosis not present

## 2023-07-07 DIAGNOSIS — H43812 Vitreous degeneration, left eye: Secondary | ICD-10-CM | POA: Diagnosis not present

## 2023-07-14 ENCOUNTER — Ambulatory Visit: Payer: Medicare HMO | Admitting: Family

## 2023-07-18 DIAGNOSIS — F319 Bipolar disorder, unspecified: Secondary | ICD-10-CM | POA: Diagnosis not present

## 2023-07-18 DIAGNOSIS — F419 Anxiety disorder, unspecified: Secondary | ICD-10-CM | POA: Diagnosis not present

## 2023-07-19 ENCOUNTER — Ambulatory Visit (INDEPENDENT_AMBULATORY_CARE_PROVIDER_SITE_OTHER): Payer: Medicare HMO | Admitting: Family

## 2023-07-19 VITALS — BP 128/74 | HR 78 | Resp 18 | Ht 67.0 in | Wt 222.6 lb

## 2023-07-19 DIAGNOSIS — I10 Essential (primary) hypertension: Secondary | ICD-10-CM

## 2023-07-19 DIAGNOSIS — K449 Diaphragmatic hernia without obstruction or gangrene: Secondary | ICD-10-CM

## 2023-07-19 DIAGNOSIS — Z23 Encounter for immunization: Secondary | ICD-10-CM

## 2023-07-19 MED ORDER — PANTOPRAZOLE SODIUM 40 MG PO TBEC: DELAYED_RELEASE_TABLET | ORAL | 0 refills | Status: DC

## 2023-07-19 MED ORDER — AMLODIPINE BESYLATE 10 MG PO TABS: 10.0000 mg | ORAL_TABLET | Freq: Every day | ORAL | 3 refills | Status: DC

## 2023-07-19 MED ORDER — LOSARTAN POTASSIUM 100 MG PO TABS: 100.0000 mg | ORAL_TABLET | Freq: Every day | ORAL | 3 refills | Status: DC

## 2023-07-19 MED ORDER — PANTOPRAZOLE SODIUM 40 MG PO TBEC: 40.0000 mg | DELAYED_RELEASE_TABLET | Freq: Two times a day (BID) | ORAL | 0 refills | Status: DC

## 2023-07-19 NOTE — Addendum Note (Signed)
Addended by: Judieth Keens on: 07/19/2023 04:08 PM   Modules accepted: Orders

## 2023-07-19 NOTE — Progress Notes (Signed)
Brandi Kerr is a 62 y.o. female with the following history as recorded in EpicCare:  Patient Active Problem List   Diagnosis Date Noted   Delusional disorder (HCC) 10/07/2022   Bipolar affective disorder, currently depressed, moderate (HCC) 11/18/2021   Gastroesophageal reflux disease 11/18/2021   Hiatal hernia 11/18/2021   Primary osteoarthritis 11/18/2021   Fibromyalgia 11/18/2021   Hypertensive urgency 04/27/2018   HTN (hypertension) 04/27/2018   Bipolar disorder (HCC) 04/27/2018   Seizure-like activity (HCC) 04/27/2018    Current Outpatient Medications  Medication Sig Dispense Refill   Emollient (LUBRIDERM SERIOUSLY SENSITIVE) LOTN Apply 1 Application topically 2 (two) times daily.     famotidine (PEPCID) 20 MG tablet Take 1 tablet (20 mg total) by mouth at bedtime. 90 tablet 1   L-LYSINE PO Take 1 capsule by mouth daily.     lamoTRIgine (LAMICTAL) 200 MG tablet Take 200 mg by mouth at bedtime.     LORazepam (ATIVAN) 0.5 MG tablet Take 0.5 mg by mouth 2 (two) times daily as needed for anxiety or sleep.     meloxicam (MOBIC) 15 MG tablet Take 15 mg by mouth every Monday, Tuesday, Wednesday, Thursday, and Friday. DO NOT TAKE ON SATURDAYS OR SUNDAYS     QUEtiapine (SEROQUEL) 200 MG tablet 200 mg.     REFRESH OPTIVE ADVANCED PF 0.5-1-0.5 % SOLN Place 1 drop into both eyes daily.     rosuvastatin (CRESTOR) 20 MG tablet Take 1 tablet (20 mg total) by mouth daily. 90 tablet 3   amLODipine (NORVASC) 10 MG tablet Take 1 tablet (10 mg total) by mouth daily. 90 tablet 3   losartan (COZAAR) 100 MG tablet Take 1 tablet (100 mg total) by mouth daily. 90 tablet 3   pantoprazole (PROTONIX) 40 MG tablet Take 1 tablet 30 minutes before breakfast and then 30 minutes before dinner as directed 60 tablet 0   No current facility-administered medications for this visit.    Allergies: Duloxetine hcl, Escitalopram, Tramadol hcl, Atorvastatin calcium, Chocolate, Lithium, Other, Prednisone, Venlafaxine, and  Caffeine  Past Medical History:  Diagnosis Date   Anxiety    Bipolar disorder (HCC)    Degenerative disc disease at L5-S1 level    Fibromyalgia    GERD (gastroesophageal reflux disease)    Hiatal hernia    High cholesterol    HTN (hypertension)    PTSD (post-traumatic stress disorder)    Sciatica     No past surgical history on file.  No family history on file.  Social History   Tobacco Use   Smoking status: Never   Smokeless tobacco: Never  Substance Use Topics   Alcohol use: Not Currently    Subjective:   Concerned about worsening symptoms related to hiatal hernia- would like to discuss different medication options;  Also needs refills on blood pressure medications; Denies any chest pain, shortness of breath, blurred vision or headache   Objective:  Vitals:   07/19/23 1441  BP: 128/74  Pulse: 78  Resp: 18  SpO2: 96%  Weight: 222 lb 9.6 oz (101 kg)  Height: 5\' 7"  (1.702 m)    General: Well developed, well nourished, in no acute distress  Skin : Warm and dry.  Head: Normocephalic and atraumatic  Lungs: Respirations unlabored; clear to auscultation bilaterally without wheeze, rales, rhonchi  CVS exam: normal rate and regular rhythm.  Neurologic: Alert and oriented; speech intact; face symmetrical; moves all extremities well; CNII-XII intact without focal deficit   Assessment:  1. Primary hypertension  2. Hiatal hernia     Plan:  Stable; refills updated; Symptoms worsening- change Pepcid to Protonix; she will try taking bid initially  x 1 week and then decrease to daily; she will discuss response with GI at upcoming visit;   Return in about 4 months (around 11/18/2023) for follow up.  No orders of the defined types were placed in this encounter.   Requested Prescriptions   Signed Prescriptions Disp Refills   pantoprazole (PROTONIX) 40 MG tablet 60 tablet 0    Sig: Take 1 tablet 30 minutes before breakfast and then 30 minutes before dinner as directed    amLODipine (NORVASC) 10 MG tablet 90 tablet 3    Sig: Take 1 tablet (10 mg total) by mouth daily.   losartan (COZAAR) 100 MG tablet 90 tablet 3    Sig: Take 1 tablet (100 mg total) by mouth daily.

## 2023-07-20 DIAGNOSIS — F419 Anxiety disorder, unspecified: Secondary | ICD-10-CM | POA: Diagnosis not present

## 2023-07-20 DIAGNOSIS — F319 Bipolar disorder, unspecified: Secondary | ICD-10-CM | POA: Diagnosis not present

## 2023-07-27 DIAGNOSIS — F319 Bipolar disorder, unspecified: Secondary | ICD-10-CM | POA: Diagnosis not present

## 2023-07-27 DIAGNOSIS — F411 Generalized anxiety disorder: Secondary | ICD-10-CM | POA: Diagnosis not present

## 2023-08-02 ENCOUNTER — Encounter: Payer: Self-pay | Admitting: Family

## 2023-08-09 ENCOUNTER — Ambulatory Visit: Payer: Medicare HMO | Admitting: Physician Assistant

## 2023-08-19 DIAGNOSIS — H33312 Horseshoe tear of retina without detachment, left eye: Secondary | ICD-10-CM | POA: Diagnosis not present

## 2023-08-30 DIAGNOSIS — H43812 Vitreous degeneration, left eye: Secondary | ICD-10-CM | POA: Diagnosis not present

## 2023-08-30 DIAGNOSIS — H43391 Other vitreous opacities, right eye: Secondary | ICD-10-CM | POA: Diagnosis not present

## 2023-08-30 DIAGNOSIS — H31093 Other chorioretinal scars, bilateral: Secondary | ICD-10-CM | POA: Diagnosis not present

## 2023-08-31 DIAGNOSIS — F419 Anxiety disorder, unspecified: Secondary | ICD-10-CM | POA: Diagnosis not present

## 2023-08-31 DIAGNOSIS — F319 Bipolar disorder, unspecified: Secondary | ICD-10-CM | POA: Diagnosis not present

## 2023-09-05 DIAGNOSIS — F419 Anxiety disorder, unspecified: Secondary | ICD-10-CM | POA: Diagnosis not present

## 2023-09-05 DIAGNOSIS — F319 Bipolar disorder, unspecified: Secondary | ICD-10-CM | POA: Diagnosis not present

## 2023-09-10 ENCOUNTER — Other Ambulatory Visit: Payer: Self-pay | Admitting: Family

## 2023-09-12 ENCOUNTER — Other Ambulatory Visit: Payer: Self-pay

## 2023-09-14 DIAGNOSIS — F319 Bipolar disorder, unspecified: Secondary | ICD-10-CM | POA: Diagnosis not present

## 2023-09-14 DIAGNOSIS — F419 Anxiety disorder, unspecified: Secondary | ICD-10-CM | POA: Diagnosis not present

## 2023-09-28 DIAGNOSIS — F419 Anxiety disorder, unspecified: Secondary | ICD-10-CM | POA: Diagnosis not present

## 2023-09-28 DIAGNOSIS — F319 Bipolar disorder, unspecified: Secondary | ICD-10-CM | POA: Diagnosis not present

## 2023-10-04 ENCOUNTER — Telehealth: Payer: Self-pay | Admitting: Family

## 2023-10-04 DIAGNOSIS — F419 Anxiety disorder, unspecified: Secondary | ICD-10-CM | POA: Diagnosis not present

## 2023-10-04 DIAGNOSIS — F319 Bipolar disorder, unspecified: Secondary | ICD-10-CM | POA: Diagnosis not present

## 2023-10-04 MED ORDER — MELOXICAM 15 MG PO TABS: 15.0000 mg | ORAL_TABLET | ORAL | 0 refills | Status: DC

## 2023-10-04 NOTE — Addendum Note (Signed)
Addended by: Judieth Keens on: 10/04/2023 02:03 PM   Modules accepted: Orders

## 2023-10-04 NOTE — Telephone Encounter (Signed)
Rx has been sent in. 

## 2023-10-04 NOTE — Telephone Encounter (Signed)
Patient called and would like a med refill on   meloxicam (MOBIC) 15 MG tablet    Please send to [facility] on [address]

## 2023-10-05 DIAGNOSIS — F419 Anxiety disorder, unspecified: Secondary | ICD-10-CM | POA: Diagnosis not present

## 2023-10-05 DIAGNOSIS — F319 Bipolar disorder, unspecified: Secondary | ICD-10-CM | POA: Diagnosis not present

## 2023-10-12 DIAGNOSIS — F411 Generalized anxiety disorder: Secondary | ICD-10-CM | POA: Diagnosis not present

## 2023-10-12 DIAGNOSIS — F419 Anxiety disorder, unspecified: Secondary | ICD-10-CM | POA: Diagnosis not present

## 2023-10-17 ENCOUNTER — Ambulatory Visit: Payer: Medicare HMO | Admitting: Orthopedic Surgery

## 2023-10-17 ENCOUNTER — Encounter: Payer: Self-pay | Admitting: Orthopedic Surgery

## 2023-10-17 ENCOUNTER — Other Ambulatory Visit: Payer: Self-pay

## 2023-10-17 DIAGNOSIS — M7552 Bursitis of left shoulder: Secondary | ICD-10-CM

## 2023-10-17 DIAGNOSIS — M7551 Bursitis of right shoulder: Secondary | ICD-10-CM

## 2023-10-17 DIAGNOSIS — M25512 Pain in left shoulder: Secondary | ICD-10-CM

## 2023-10-17 NOTE — Progress Notes (Signed)
Office Visit Note   Patient: Brandi Kerr           Date of Birth: 05-12-1961           MRN: [id number] Visit Date: 10/17/2023 Requested by: Olive Bass, FNP [address] [place],  [place] [address] PCP: Olive Bass, FNP  Subjective: Chief Complaint  Patient presents with   Left Shoulder - Pain   Right Shoulder - Pain    HPI: Brandi Kerr is a 62 y.o. female who presents to the office reporting bilateral shoulder pain left worse than right.  She is requesting bilateral injections.  She has had these in the past when she was in [place].  Pain does not wake her from sleep.  Patient does not have diabetes.  Husband works for [employer].  Denies much in the way of weakness..                ROS: All systems reviewed are negative as they relate to the chief complaint within the history of present illness.  Patient denies fevers or chills.  Assessment & Plan: Visit Diagnoses:  1. Left shoulder pain, unspecified chronicity     Plan: Impression is bilateral shoulder bursitis with a little bit more coarseness and grinding on the left-hand side.  Exam in that shoulder with ultrasound and I think she does have a near full-thickness supraspinatus tear without retraction.  Infraspinatus and subscap appear intact.  Plan is bilateral bursal injections performed today.  Threshold is very low for further imaging on that left shoulder.  Follow-Up Instructions: No follow-ups on file.   Orders:  Orders Placed This Encounter  Procedures   US Guided Needle Placement - No Linked Charges   No orders of the defined types were placed in this encounter.     Procedures: Large Joint Inj: bilateral subacromial bursa on 10/17/2023 6:05 PM Indications: diagnostic evaluation and pain Details: 18 G 1.5 in needle, posterior approach  Arthrogram: No  Outcome: tolerated well, no immediate complications Procedure, treatment alternatives, risks and benefits explained,  specific risks discussed. Consent was given by the patient. Immediately prior to procedure a time out was called to verify the correct patient, procedure, equipment, support staff and site/side marked as required. Patient was prepped and draped in the usual sterile fashion.       Clinical Data: No additional findings.  Objective: Vital Signs: There were no vitals taken for this visit.  Physical Exam:  Constitutional: Patient appears well-developed HEENT:  Head: Normocephalic Eyes:EOM are normal Neck: Normal range of motion Cardiovascular: Normal rate Pulmonary/chest: Effort normal Neurologic: Patient is alert Skin: Skin is warm Psychiatric: Patient has normal mood and affect  Ortho Exam: Ortho exam demonstrates passive range of motion bilaterally of 60/100/165.  Radial pulse intact bilaterally.  She has excellent infraspinatus supraspinatus and subscap strength in both shoulders.  No masses lymphadenopathy or skin changes noted in either shoulder girdle region.  No discrete AC joint tenderness.  Neck range of motion is full.  Negative O'Brien's testing bilaterally.  Specialty Comments:  No specialty comments available.  Imaging: US Guided Needle Placement - No Linked Charges Result Date: 10/17/2023 No ultrasound imaging with needle placement performed today    PMFS History: Patient Active Problem List   Diagnosis Date Noted   Delusional disorder (HCC) 10/07/2022   Bipolar affective disorder, currently depressed, moderate (HCC) 11/18/2021   Gastroesophageal reflux disease 11/18/2021   Hiatal hernia 11/18/2021   Primary osteoarthritis 11/18/2021  Fibromyalgia 11/18/2021   Hypertensive urgency 04/27/2018   HTN (hypertension) 04/27/2018   Bipolar disorder (HCC) 04/27/2018   Seizure-like activity (HCC) 04/27/2018   Past Medical History:  Diagnosis Date   Anxiety    Bipolar disorder (HCC)    Degenerative disc disease at L5-S1 level    Fibromyalgia    GERD  (gastroesophageal reflux disease)    Hiatal hernia    High cholesterol    HTN (hypertension)    PTSD (post-traumatic stress disorder)    Sciatica     History reviewed. No pertinent family history.  History reviewed. No pertinent surgical history. Social History   Occupational History   Not on file  Tobacco Use   Smoking status: Never   Smokeless tobacco: Never  Substance and Sexual Activity   Alcohol use: Not Currently   Drug use: Not Currently    Types: Marijuana   Sexual activity: Not on file

## 2023-10-19 DIAGNOSIS — F411 Generalized anxiety disorder: Secondary | ICD-10-CM | POA: Diagnosis not present

## 2023-10-19 DIAGNOSIS — F319 Bipolar disorder, unspecified: Secondary | ICD-10-CM | POA: Diagnosis not present

## 2023-11-17 ENCOUNTER — Ambulatory Visit: Payer: Medicare PPO | Admitting: Physician Assistant

## 2023-11-17 ENCOUNTER — Encounter: Payer: Self-pay | Admitting: Physician Assistant

## 2023-11-17 VITALS — BP 116/76 | HR 84 | Ht 67.0 in | Wt 222.1 lb

## 2023-11-17 DIAGNOSIS — K219 Gastro-esophageal reflux disease without esophagitis: Secondary | ICD-10-CM

## 2023-11-17 DIAGNOSIS — Z9049 Acquired absence of other specified parts of digestive tract: Secondary | ICD-10-CM | POA: Diagnosis not present

## 2023-11-17 DIAGNOSIS — K449 Diaphragmatic hernia without obstruction or gangrene: Secondary | ICD-10-CM

## 2023-11-17 DIAGNOSIS — G8929 Other chronic pain: Secondary | ICD-10-CM

## 2023-11-17 DIAGNOSIS — R1011 Right upper quadrant pain: Secondary | ICD-10-CM

## 2023-11-17 DIAGNOSIS — R1013 Epigastric pain: Secondary | ICD-10-CM | POA: Diagnosis not present

## 2023-11-17 NOTE — Progress Notes (Signed)
I agree with the assessment and plan as outlined by Ms. Lemmon. 

## 2023-11-17 NOTE — Progress Notes (Signed)
Chief Complaint: Hiatal hernia, abdominal pain and GERD  HPI:    Brandi Kerr is a 63 year old female with a past medical history as listed below including anxiety, bipolar disorder, fibromyalgia, GERD, PTSD and multiple others, who was referred to me by Olive Bass,* for a complaint of hiatal hernia, abdominal pain and GERD.     05/13/2023 Cologuard negative.    06/07/2023 hemoglobin A1c normal, CMP with a glucose of 101 and otherwise normal.    06/24/2023 CT the abdomen pelvis with contrast done for intermittent mid right abdominal pain for several months with moderate hiatal hernia and colonic diverticulosis as well as left kidney cyst.    07/19/2023 patient seen by PCP and at that time discussed worsening reflux.  Her Pepcid was changed to Protonix 40 mg twice daily for a week and then decrease to daily.    Today, patient explains that she recently moved to [facility] about a year ago, prior to that about 20 years ago had followed with a gastroenterologist who did an EGD and diagnosed her with a hiatal hernia.  Otherwise has had Cologuard's negative x 2, the most recent in July of last year.  Currently describes that back in August/September timeframe of 2024 she had an issue with some right upper quadrant pain that seemed to linger for about 3-4 days and actually woke up vomiting at 1 point.  She eventually had a CT as above.  She followed with her PCP who put her on Pantoprazole 40 mg twice daily which she is still taking and feels like this has helped with most of her symptoms including reflux and some epigastric tenderness.  Tells me she still occasionally feels like her epigastrium bulges and feels tender across her abdomen, but that is fairly chronic for her.  Very rarely now has some right upper quadrant pain that seems to pass if she takes a stool softener.  (Status post cholecystectomy back in 1994) does note that she is maybe a little constipated during those times.  Otherwise feels  like the Pantoprazole is stabilized her symptoms.  Just wants to make sure that her "hiatal hernia does not need checked out".    Moved to [place] to live by her best friend after a divorce.  Has 3 grown children.    Denies fever, chills, unintentional weight loss, further nausea or vomiting or uncontrolled reflux symptoms.  Past Medical History:  Diagnosis Date   Anxiety    Bipolar disorder (HCC)    Degenerative disc disease at L5-S1 level    Fibromyalgia    GERD (gastroesophageal reflux disease)    Hiatal hernia    High cholesterol    HTN (hypertension)    PTSD (post-traumatic stress disorder)    Sciatica     Surgical history: Cholecystectomy 1994  Current Outpatient Medications  Medication Sig Dispense Refill   amLODipine (NORVASC) 10 MG tablet Take 1 tablet (10 mg total) by mouth daily. 90 tablet 3   Emollient (LUBRIDERM SERIOUSLY SENSITIVE) LOTN Apply 1 Application topically 2 (two) times daily.     famotidine (PEPCID) 20 MG tablet Take 1 tablet (20 mg total) by mouth at bedtime. 90 tablet 1   L-LYSINE PO Take 1 capsule by mouth daily.     lamoTRIgine (LAMICTAL) 200 MG tablet Take 200 mg by mouth at bedtime.     LORazepam (ATIVAN) 0.5 MG tablet Take 0.5 mg by mouth 2 (two) times daily as needed for anxiety or sleep.     losartan (COZAAR)  100 MG tablet Take 1 tablet (100 mg total) by mouth daily. 90 tablet 3   meloxicam (MOBIC) 15 MG tablet Take 1 tablet (15 mg total) by mouth every Monday, Tuesday, Wednesday, Thursday, and Friday. DO NOT TAKE ON SATURDAYS OR SUNDAYS 30 tablet 0   pantoprazole (PROTONIX) 40 MG tablet TAKE 1 TABLET 30 MINUTES BEFORE BREAKFAST AND THEN 30 MINUTES BEFORE DINNER AS DIRECTED 180 tablet 1   QUEtiapine (SEROQUEL) 200 MG tablet 200 mg.     REFRESH OPTIVE ADVANCED PF 0.5-1-0.5 % SOLN Place 1 drop into both eyes daily.     rosuvastatin (CRESTOR) 20 MG tablet Take 1 tablet (20 mg total) by mouth daily. 90 tablet 3   No current facility-administered  medications for this visit.    Allergies as of 11/17/2023 - Review Complete 10/17/2023  Allergen Reaction Noted   Duloxetine hcl Other (See Comments) 11/10/2021   Escitalopram Other (See Comments) 10/07/2022   Tramadol hcl Other (See Comments) 11/10/2021   Atorvastatin calcium Other (See Comments) 11/10/2021   Chocolate Nausea And Vomiting and Other (See Comments) 10/07/2022   Lithium Other (See Comments) 09/29/2022   Other Other (See Comments) 10/07/2022   Prednisone Other (See Comments) 11/10/2021   Venlafaxine Other (See Comments) and Hypertension 01/22/2022   Caffeine Anxiety and Other (See Comments) 10/07/2022    No family history on file.  Social History   Socioeconomic History   Marital status: Divorced    Spouse name: Not on file   Number of children: Not on file   Years of education: Not on file   Highest education level: 12th grade  Occupational History   Not on file  Tobacco Use   Smoking status: Never   Smokeless tobacco: Never  Substance and Sexual Activity   Alcohol use: Not Currently   Drug use: Not Currently    Types: Marijuana   Sexual activity: Not on file  Other Topics Concern   Not on file  Social History Narrative   Not on file   Social Drivers of Health   Financial Resource Strain: Low Risk  (11/11/2023)   Overall Financial Resource Strain (CARDIA)    Difficulty of Paying Living Expenses: Not hard at all  Food Insecurity: No Food Insecurity (11/11/2023)   Hunger Vital Sign    Worried About Running Out of Food in the Last Year: Never true    Ran Out of Food in the Last Year: Never true  Transportation Needs: No Transportation Needs (11/11/2023)   PRAPARE - Administrator, Civil Service (Medical): No    Lack of Transportation (Non-Medical): No  Physical Activity: Insufficiently Active (11/11/2023)   Exercise Vital Sign    Days of Exercise per Week: 1 day    Minutes of Exercise per Session: 10 min  Stress: No Stress Concern Present  (11/11/2023)   [employer] of Occupational Health - Occupational Stress Questionnaire    Feeling of Stress : Not at all  Social Connections: Moderately Isolated (11/11/2023)   Social Connection and Isolation Panel [NHANES]    Frequency of Communication with Friends and Family: More than three times a week    Frequency of Social Gatherings with Friends and Family: More than three times a week    Attends Religious Services: 1 to 4 times per year    Active Member of [employer] or Organizations: No    Attends Banker Meetings: Never    Marital Status: Divorced  Catering manager Violence: Not At Risk (05/06/2023)  Humiliation, Afraid, Rape, and Kick questionnaire    Fear of Current or Ex-Partner: No    Emotionally Abused: No    Physically Abused: No    Sexually Abused: No    Review of Systems:    Constitutional: No unintentional weight loss, fever or chills Skin: No rash Cardiovascular: No chest pain Respiratory: No SOB Gastrointestinal: See HPI and otherwise negative Genitourinary: No dysuria  Neurological: No headache, dizziness or syncope Musculoskeletal: No new muscle or joint pain Hematologic: No bleeding  Psychiatric: No history of depression or anxiety   Physical Exam:  Vital signs: BP 116/76   Pulse 84   Ht 5\' 7"  (1.702 m) Comment: measured in office  Wt 222 lb 2 oz (100.8 kg)   SpO2 96%   BMI 34.79 kg/m    Constitutional:   Pleasant overweight Caucasian female appears to be in NAD, Well developed, Well nourished, alert and cooperative Head:  Normocephalic and atraumatic. Eyes:   PEERL, EOMI. No icterus. Conjunctiva pink. Ears:  Normal auditory acuity. Neck:  Supple Throat: Oral cavity and pharynx without inflammation, swelling or lesion.  Respiratory: Respirations even and unlabored. Lungs clear to auscultation bilaterally.   No wheezes, crackles, or rhonchi.  Cardiovascular: Normal S1, S2. No MRG. Regular rate and rhythm. No peripheral edema, cyanosis  or pallor.  Gastrointestinal:  Soft, nondistended, mild epigastric TTP. No rebound or guarding. Normal bowel sounds. No appreciable masses or hepatomegaly. Rectal:  Not performed.  Msk:  Symmetrical without gross deformities. Without edema, no deformity or joint abnormality.  Neurologic:  Alert and  oriented x4;  grossly normal neurologically.  Skin:   Dry and intact without significant lesions or rashes. Psychiatric: Demonstrates good judgement and reason without abnormal affect or behaviors.  RELEVANT LABS AND IMAGING: CBC    Component Value Date/Time   WBC 10.7 (H) 04/12/2023 1543   RBC 4.74 04/12/2023 1543   HGB 13.6 04/12/2023 1543   HCT 42.0 04/12/2023 1543   PLT 314.0 04/12/2023 1543   MCV 88.6 04/12/2023 1543   MCH 28.1 10/07/2022 0932   MCHC 32.4 04/12/2023 1543   RDW 13.6 04/12/2023 1543   LYMPHSABS 4.1 (H) 04/12/2023 1543   MONOABS 0.7 04/12/2023 1543   EOSABS 0.1 04/12/2023 1543   BASOSABS 0.2 (H) 04/12/2023 1543    CMP     Component Value Date/Time   NA 140 06/07/2023 1325   K 3.8 06/07/2023 1325   CL 104 06/07/2023 1325   CO2 25 06/07/2023 1325   GLUCOSE 101 (H) 06/07/2023 1325   BUN 16 06/07/2023 1325   CREATININE 1.16 06/07/2023 1325   CALCIUM 9.7 06/07/2023 1325   PROT 7.1 06/07/2023 1325   ALBUMIN 4.7 06/07/2023 1325   AST 17 06/07/2023 1325   ALT 15 06/07/2023 1325   ALKPHOS 67 06/07/2023 1325   BILITOT 0.5 06/07/2023 1325   GFRNONAA 54 (L) 10/07/2022 0932   GFRAA >60 04/28/2018 0451    Assessment: 1.  Epigastric pain/GERD: Epigastric discomfort for many years, 20 years ago had an EGD and diagnosed with a hiatal hernia, epigastric pain is at its chronic level now after the addition of Pantoprazole 40 mg twice a day which is completely eradicated her reflux symptoms; most likely chronic gastritis +/- hiatal hernia 2.  Right upper quadrant pain: Only occasional pain now, did have some consistent pain about 6 months ago with an episode of vomiting,  uncertain if this is viral or possibly related to constipation as it seems relieved after a bowel movement  Plan: 1.  For the most part all of her symptoms are controlled on Pantoprazole 40 mg twice a day.  Would recommend she continue this.  Her PCP refills this for her. 2.  Discussed that if she has breakthrough symptoms regardless of medicine above or starts with pain in her chest or trouble swallowing or continued nausea/vomiting these would all be reasons to pursue a diagnostic EGD. 3.  Discussed right upper quadrant pain, it could be this is related to stool and possibly adhesions in this area after cholecystectomy, either way symptoms seem relieved after she has a good bowel movement. 4.  Patient wished to be established with a female provider today.  Will assign her to Dr. Leonides Schanz.  Hyacinth Meeker, PA-C [facility] 11/17/2023, 2:01 PM  Cc: Olive Bass,*

## 2023-11-18 ENCOUNTER — Ambulatory Visit: Payer: Medicare HMO | Admitting: Family

## 2023-11-22 DIAGNOSIS — F319 Bipolar disorder, unspecified: Secondary | ICD-10-CM | POA: Diagnosis not present

## 2023-11-22 DIAGNOSIS — F419 Anxiety disorder, unspecified: Secondary | ICD-10-CM | POA: Diagnosis not present

## 2023-11-25 ENCOUNTER — Ambulatory Visit (INDEPENDENT_AMBULATORY_CARE_PROVIDER_SITE_OTHER): Payer: Medicare PPO | Admitting: Family

## 2023-11-25 ENCOUNTER — Encounter: Payer: Self-pay | Admitting: Family

## 2023-11-25 VITALS — BP 130/82 | HR 81 | Ht 67.0 in | Wt 225.0 lb

## 2023-11-25 DIAGNOSIS — I10 Essential (primary) hypertension: Secondary | ICD-10-CM

## 2023-11-25 DIAGNOSIS — M1991 Primary osteoarthritis, unspecified site: Secondary | ICD-10-CM | POA: Diagnosis not present

## 2023-11-25 MED ORDER — MELOXICAM 15 MG PO TABS: 15.0000 mg | ORAL_TABLET | ORAL | 0 refills | Status: DC

## 2023-11-25 NOTE — Progress Notes (Signed)
Brandi Kerr is a 63 y.o. female with the following history as recorded in EpicCare:  Patient Active Problem List   Diagnosis Date Noted   Delusional disorder (HCC) 10/07/2022   Bipolar affective disorder, currently depressed, moderate (HCC) 11/18/2021   Gastroesophageal reflux disease 11/18/2021   Hiatal hernia 11/18/2021   Primary osteoarthritis 11/18/2021   Fibromyalgia 11/18/2021   Hypertensive urgency 04/27/2018   HTN (hypertension) 04/27/2018   Bipolar disorder (HCC) 04/27/2018   Seizure-like activity (HCC) 04/27/2018    Current Outpatient Medications  Medication Sig Dispense Refill   amLODipine (NORVASC) 10 MG tablet Take 1 tablet (10 mg total) by mouth daily. 90 tablet 3   Emollient (LUBRIDERM SERIOUSLY SENSITIVE) LOTN Apply 1 Application topically 2 (two) times daily.     L-LYSINE PO Take 1 capsule by mouth daily.     lamoTRIgine (LAMICTAL) 200 MG tablet Take 200 mg by mouth at bedtime.     LORazepam (ATIVAN) 0.5 MG tablet Take 0.5 mg by mouth 2 (two) times daily as needed for anxiety or sleep.     losartan (COZAAR) 100 MG tablet Take 1 tablet (100 mg total) by mouth daily. 90 tablet 3   pantoprazole (PROTONIX) 40 MG tablet TAKE 1 TABLET 30 MINUTES BEFORE BREAKFAST AND THEN 30 MINUTES BEFORE DINNER AS DIRECTED 180 tablet 1   QUEtiapine (SEROQUEL) 200 MG tablet 200 mg.     REFRESH OPTIVE ADVANCED PF 0.5-1-0.5 % SOLN Place 1 drop into both eyes daily.     rosuvastatin (CRESTOR) 20 MG tablet Take 1 tablet (20 mg total) by mouth daily. 90 tablet 3   meloxicam (MOBIC) 15 MG tablet Take 1 tablet (15 mg total) by mouth every Monday, Tuesday, Wednesday, Thursday, and Friday. DO NOT TAKE ON SATURDAYS OR SUNDAYS 90 tablet 0   No current facility-administered medications for this visit.    Allergies: Duloxetine hcl, Escitalopram, Tramadol hcl, Venlafaxine, Atorvastatin calcium, Chocolate, Lithium, Other, Prednisone, and Caffeine  Past Medical History:  Diagnosis Date   Anxiety     Bipolar disorder (HCC)    Degenerative disc disease at L5-S1 level    Fibromyalgia    GERD (gastroesophageal reflux disease)    Hiatal hernia    High cholesterol    HTN (hypertension)    PTSD (post-traumatic stress disorder)    Sciatica     No past surgical history on file.  No family history on file.  Social History   Tobacco Use   Smoking status: Never   Smokeless tobacco: Never  Substance Use Topics   Alcohol use: Not Currently    Subjective:   6 month follow up on chronic care needs; no acute concerns today; does need labs updated for monitoring of psychiatric medications;   Objective:  Vitals:   11/25/23 1300  BP: 130/82  Pulse: 81  SpO2: 98%  Weight: 225 lb (102.1 kg)  Height: 5\' 7"  (1.702 m)    General: Well developed, well nourished, in no acute distress  Skin : Warm and dry.  Head: Normocephalic and atraumatic  Lungs: Respirations unlabored; clear to auscultation bilaterally without wheeze, rales, rhonchi  CVS exam: normal rate and regular rhythm.  Neurologic: Alert and oriented; speech intact; face symmetrical; moves all extremities well; CNII-XII intact without focal deficit   Assessment:  1. Primary hypertension   2. Primary osteoarthritis, unspecified site     Plan:  Stable; continue same medications; labs updated as requested; follow up in 6 months, sooner prn.  Stable; refill updated on Mobic;  No follow-ups on file.  Orders Placed This Encounter  Procedures   CBC with Differential/Platelet   Comp Met (CMET)    Requested Prescriptions   Signed Prescriptions Disp Refills   meloxicam (MOBIC) 15 MG tablet 90 tablet 0    Sig: Take 1 tablet (15 mg total) by mouth every Monday, Tuesday, Wednesday, Thursday, and Friday. DO NOT TAKE ON SATURDAYS OR SUNDAYS

## 2023-11-26 LAB — COMPREHENSIVE METABOLIC PANEL
AG Ratio: 1.8 (calc) (ref 1.0–2.5)
ALT: 13 U/L (ref 6–29)
AST: 16 U/L (ref 10–35)
Albumin: 4.4 g/dL (ref 3.6–5.1)
Alkaline phosphatase (APISO): 86 U/L (ref 37–153)
BUN/Creatinine Ratio: 12 (calc) (ref 6–22)
BUN: 13 mg/dL (ref 7–25)
CO2: 26 mmol/L (ref 20–32)
Calcium: 9.4 mg/dL (ref 8.6–10.4)
Chloride: 104 mmol/L (ref 98–110)
Creat: 1.07 mg/dL — ABNORMAL HIGH (ref 0.50–1.05)
Globulin: 2.5 g/dL (ref 1.9–3.7)
Glucose, Bld: 98 mg/dL (ref 65–99)
Potassium: 3.9 mmol/L (ref 3.5–5.3)
Sodium: 141 mmol/L (ref 135–146)
Total Bilirubin: 0.4 mg/dL (ref 0.2–1.2)
Total Protein: 6.9 g/dL (ref 6.1–8.1)

## 2023-11-26 LAB — CBC WITH DIFFERENTIAL/PLATELET
Absolute Lymphocytes: 3184 {cells}/uL (ref 850–3900)
Absolute Monocytes: 474 {cells}/uL (ref 200–950)
Basophils Absolute: 47 {cells}/uL (ref 0–200)
Basophils Relative: 0.6 %
Eosinophils Absolute: 126 {cells}/uL (ref 15–500)
Eosinophils Relative: 1.6 %
HCT: 38 % (ref 35.0–45.0)
Hemoglobin: 12.8 g/dL (ref 11.7–15.5)
MCH: 27.8 pg (ref 27.0–33.0)
MCHC: 33.7 g/dL (ref 32.0–36.0)
MCV: 82.6 fL (ref 80.0–100.0)
MPV: 10.1 fL (ref 7.5–12.5)
Monocytes Relative: 6 %
Neutro Abs: 4069 {cells}/uL (ref 1500–7800)
Neutrophils Relative %: 51.5 %
Platelets: 350 10*3/uL (ref 140–400)
RBC: 4.6 10*6/uL (ref 3.80–5.10)
RDW: 13 % (ref 11.0–15.0)
Total Lymphocyte: 40.3 %
WBC: 7.9 10*3/uL (ref 3.8–10.8)

## 2023-11-28 ENCOUNTER — Encounter: Payer: Self-pay | Admitting: Family

## 2023-12-27 ENCOUNTER — Ambulatory Visit (INDEPENDENT_AMBULATORY_CARE_PROVIDER_SITE_OTHER): Payer: Medicare PPO | Admitting: Physician Assistant

## 2023-12-27 ENCOUNTER — Encounter: Payer: Self-pay | Admitting: Physician Assistant

## 2023-12-27 DIAGNOSIS — M1712 Unilateral primary osteoarthritis, left knee: Secondary | ICD-10-CM

## 2023-12-27 MED ORDER — LIDOCAINE HCL 1 % IJ SOLN
3.0000 mL | INTRAMUSCULAR | Status: AC | PRN
Start: 2023-12-27 — End: 2023-12-27
  Administered 2023-12-27: 3 mL

## 2023-12-27 NOTE — Progress Notes (Signed)
 Office Visit Note   Patient: Brandi Kerr           Date of Birth: [birth year]           MRN: [id number] Visit Date: 12/27/2023              Requested by: Olive Bass, FNP [address] [place],  [place] [address] PCP: Olive Bass, FNP   Assessment & Plan: Visit Diagnoses: Left knee pain  Plan: Pleasant 63 year old woman who is a patient of Dr. Diamantina Providence.  She has a history of left knee arthritis and right knee arthritis she periodically gets injections on these.  She is requesting only a left knee injection today no injury  Follow-Up Instructions: No follow-ups on file.   Orders:  No orders of the defined types were placed in this encounter.  No orders of the defined types were placed in this encounter.     Procedures: Large Joint Inj: L knee on 12/27/2023 11:32 AM Indications: pain and diagnostic evaluation Details: 25 G 1.5 in needle, anteromedial approach  Arthrogram: No  Medications: 3 mL lidocaine 1 % Outcome: tolerated well, no immediate complications Procedure, treatment alternatives, risks and benefits explained, specific risks discussed. Consent was given by the patient.       Clinical Data: No additional findings.   Subjective: No chief complaint on file.   HPI pleasant 63 year old woman who established care for her multiple both shoulder and bilateral knee pain with Dr. August Saucer.  She has gotten injections to his shoulders before Brandi Kerr.  She is also gotten injections in her knee when she lived in [place].  She comes in today requesting a steroid injection into her left knee no injury  Review of Systems  All other systems reviewed and are negative.    Objective: Vital Signs: There were no vitals taken for this visit.  Physical Exam Constitutional:      Appearance: Normal appearance.  Pulmonary:     Effort: Pulmonary effort is normal.  Skin:    General: Skin is warm and dry.  Neurological:     General: No  focal deficit present.     Mental Status: She is alert.  Psychiatric:        Mood and Affect: Mood normal.        Behavior: Behavior normal.     Ortho Exam Examination of her left knee no effusion no erythema compartments are soft and compressible she has good stability positive grinding on range of motion Specialty Comments:  No specialty comments available.  Imaging: No results found.   PMFS History: Patient Active Problem List   Diagnosis Date Noted   Delusional disorder (HCC) 10/07/2022   Bipolar affective disorder, currently depressed, moderate (HCC) 11/18/2021   Gastroesophageal reflux disease 11/18/2021   Hiatal hernia 11/18/2021   Primary osteoarthritis 11/18/2021   Fibromyalgia 11/18/2021   Hypertensive urgency 04/27/2018   HTN (hypertension) 04/27/2018   Bipolar disorder (HCC) 04/27/2018   Seizure-like activity (HCC) 04/27/2018   Past Medical History:  Diagnosis Date   Anxiety    Bipolar disorder (HCC)    Degenerative disc disease at L5-S1 level    Fibromyalgia    GERD (gastroesophageal reflux disease)    Hiatal hernia    High cholesterol    HTN (hypertension)    PTSD (post-traumatic stress disorder)    Sciatica     History reviewed. No pertinent family history.  History reviewed. No pertinent surgical history. Social History  Occupational History   Not on file  Tobacco Use   Smoking status: Never   Smokeless tobacco: Never  Substance and Sexual Activity   Alcohol use: Not Currently   Drug use: Not Currently    Types: Marijuana   Sexual activity: Not on file

## 2024-01-14 DIAGNOSIS — F319 Bipolar disorder, unspecified: Secondary | ICD-10-CM | POA: Diagnosis not present

## 2024-01-14 DIAGNOSIS — F419 Anxiety disorder, unspecified: Secondary | ICD-10-CM | POA: Diagnosis not present

## 2024-02-15 DIAGNOSIS — F319 Bipolar disorder, unspecified: Secondary | ICD-10-CM | POA: Diagnosis not present

## 2024-02-15 DIAGNOSIS — F411 Generalized anxiety disorder: Secondary | ICD-10-CM | POA: Diagnosis not present

## 2024-02-15 DIAGNOSIS — F419 Anxiety disorder, unspecified: Secondary | ICD-10-CM | POA: Diagnosis not present

## 2024-02-17 ENCOUNTER — Other Ambulatory Visit: Payer: Self-pay

## 2024-02-17 ENCOUNTER — Encounter (HOSPITAL_COMMUNITY): Payer: Self-pay

## 2024-02-17 ENCOUNTER — Emergency Department (HOSPITAL_COMMUNITY)
Admission: EM | Admit: 2024-02-17 | Discharge: 2024-02-18 | Disposition: A | Discharge status: Home / Self Care | Attending: Emergency Medicine | Admitting: Emergency Medicine

## 2024-02-17 DIAGNOSIS — Z79899 Other long term (current) drug therapy: Secondary | ICD-10-CM | POA: Diagnosis not present

## 2024-02-17 DIAGNOSIS — F312 Bipolar disorder, current episode manic severe with psychotic features: Secondary | ICD-10-CM | POA: Diagnosis not present

## 2024-02-17 DIAGNOSIS — I10 Essential (primary) hypertension: Secondary | ICD-10-CM | POA: Diagnosis not present

## 2024-02-17 DIAGNOSIS — F12959 Cannabis use, unspecified with psychotic disorder, unspecified: Secondary | ICD-10-CM | POA: Insufficient documentation

## 2024-02-17 DIAGNOSIS — G4700 Insomnia, unspecified: Secondary | ICD-10-CM | POA: Diagnosis not present

## 2024-02-17 DIAGNOSIS — F99 Mental disorder, not otherwise specified: Secondary | ICD-10-CM

## 2024-02-17 DIAGNOSIS — F39 Unspecified mood [affective] disorder: Secondary | ICD-10-CM | POA: Diagnosis not present

## 2024-02-17 DIAGNOSIS — E876 Hypokalemia: Secondary | ICD-10-CM | POA: Insufficient documentation

## 2024-02-17 DIAGNOSIS — F3110 Bipolar disorder, current episode manic without psychotic features, unspecified: Secondary | ICD-10-CM | POA: Diagnosis present

## 2024-02-17 LAB — CBC WITH DIFFERENTIAL/PLATELET
Abs Immature Granulocytes: 0.03 10*3/uL (ref 0.00–0.07)
Basophils Absolute: 0.1 10*3/uL (ref 0.0–0.1)
Basophils Relative: 1 %
Eosinophils Absolute: 0.1 10*3/uL (ref 0.0–0.5)
Eosinophils Relative: 0 %
HCT: 44.6 % (ref 36.0–46.0)
Hemoglobin: 14.2 g/dL (ref 12.0–15.0)
Immature Granulocytes: 0 %
Lymphocytes Relative: 35 %
Lymphs Abs: 4.7 10*3/uL — ABNORMAL HIGH (ref 0.7–4.0)
MCH: 27.6 pg (ref 26.0–34.0)
MCHC: 31.8 g/dL (ref 30.0–36.0)
MCV: 86.8 fL (ref 80.0–100.0)
Monocytes Absolute: 1.3 10*3/uL — ABNORMAL HIGH (ref 0.1–1.0)
Monocytes Relative: 9 %
Neutro Abs: 7.4 10*3/uL (ref 1.7–7.7)
Neutrophils Relative %: 55 %
Platelets: 339 10*3/uL (ref 150–400)
RBC: 5.14 MIL/uL — ABNORMAL HIGH (ref 3.87–5.11)
RDW: 13.6 % (ref 11.5–15.5)
WBC: 13.5 10*3/uL — ABNORMAL HIGH (ref 4.0–10.5)
nRBC: 0 % (ref 0.0–0.2)

## 2024-02-17 LAB — COMPREHENSIVE METABOLIC PANEL WITH GFR
ALT: 20 U/L (ref 0–44)
AST: 24 U/L (ref 15–41)
Albumin: 4.1 g/dL (ref 3.5–5.0)
Alkaline Phosphatase: 65 U/L (ref 38–126)
Anion gap: 12 (ref 5–15)
BUN: 15 mg/dL (ref 8–23)
CO2: 22 mmol/L (ref 22–32)
Calcium: 9.5 mg/dL (ref 8.9–10.3)
Chloride: 106 mmol/L (ref 98–111)
Creatinine, Ser: 1.54 mg/dL — ABNORMAL HIGH (ref 0.44–1.00)
GFR, Estimated: 38 mL/min — ABNORMAL LOW (ref >60)
Glucose, Bld: 112 mg/dL — ABNORMAL HIGH (ref 70–99)
Potassium: 2.9 mmol/L — ABNORMAL LOW (ref 3.5–5.1)
Sodium: 140 mmol/L (ref 135–145)
Total Bilirubin: 1 mg/dL (ref 0.0–1.2)
Total Protein: 7 g/dL (ref 6.5–8.1)

## 2024-02-17 LAB — RAPID URINE DRUG SCREEN, HOSP PERFORMED
Amphetamines: NOT DETECTED
Barbiturates: NOT DETECTED
Benzodiazepines: NOT DETECTED
Cocaine: NOT DETECTED
Opiates: NOT DETECTED
Tetrahydrocannabinol: POSITIVE — AB

## 2024-02-17 LAB — MAGNESIUM: Magnesium: 1.9 mg/dL (ref 1.7–2.4)

## 2024-02-17 LAB — ETHANOL: Alcohol, Ethyl (B): <10 mg/dL (ref <10)

## 2024-02-17 MED ORDER — POTASSIUM CHLORIDE 20 MEQ PO PACK
60.0000 meq | PACK | Freq: Once | ORAL | Status: AC
  Administered 2024-02-17: 60 meq via ORAL
  Filled 2024-02-17: qty 3

## 2024-02-17 MED ORDER — AMLODIPINE BESYLATE 5 MG PO TABS
10.0000 mg | ORAL_TABLET | Freq: Every day | ORAL | Status: DC
  Administered 2024-02-18: 10 mg via ORAL
  Filled 2024-02-17: qty 2

## 2024-02-17 MED ORDER — POLYVINYL ALCOHOL 1.4 % OP SOLN
1.0000 [drp] | Freq: Every day | OPHTHALMIC | Status: DC
  Filled 2024-02-17: qty 15

## 2024-02-17 MED ORDER — ROSUVASTATIN CALCIUM 20 MG PO TABS
20.0000 mg | ORAL_TABLET | Freq: Every day | ORAL | Status: DC
  Administered 2024-02-18: 20 mg via ORAL
  Filled 2024-02-17: qty 1

## 2024-02-17 MED ORDER — QUETIAPINE FUMARATE 100 MG PO TABS
200.0000 mg | ORAL_TABLET | Freq: Every day | ORAL | Status: DC
  Administered 2024-02-17: 200 mg via ORAL
  Filled 2024-02-17: qty 2

## 2024-02-17 MED ORDER — LOSARTAN POTASSIUM 50 MG PO TABS
100.0000 mg | ORAL_TABLET | Freq: Every day | ORAL | Status: DC
  Administered 2024-02-18: 100 mg via ORAL
  Filled 2024-02-17 (×2): qty 2

## 2024-02-17 MED ORDER — PANTOPRAZOLE SODIUM 40 MG PO TBEC: 40.0000 mg | DELAYED_RELEASE_TABLET | Freq: Two times a day (BID) | ORAL | Status: DC | PRN

## 2024-02-17 MED ORDER — LORAZEPAM 0.5 MG PO TABS: 0.5000 mg | ORAL_TABLET | Freq: Two times a day (BID) | ORAL | Status: DC | PRN

## 2024-02-17 MED ORDER — FAMOTIDINE 20 MG PO TABS
20.0000 mg | ORAL_TABLET | Freq: Every day | ORAL | Status: DC
  Administered 2024-02-18: 20 mg via ORAL
  Filled 2024-02-17: qty 1

## 2024-02-17 MED ORDER — LAMOTRIGINE 100 MG PO TABS
200.0000 mg | ORAL_TABLET | Freq: Every day | ORAL | Status: DC
  Administered 2024-02-17: 200 mg via ORAL
  Filled 2024-02-17: qty 2

## 2024-02-17 NOTE — ED Notes (Signed)
 Pt endorses middle right abdominal pain that she states started today. I spoke with patient's visitor linda whom patient lives with, and who pt dubs "sister from another mother." Pt has flight of ideas, delusions, and hx bipolar. Per linda patient has taken all of her medications religiously and has not misses any doses. However, over the last few days (3) has become increasingly anxious, and waxes and wanes from "super depressed and low" to "high, highs." Patient states that her necklace is "her power" and says [name] "cast a lot of light" unto her. [name] appears very supportive and attentive to patient needs. Per linda, patient has hx of stays at [facility], most recently this past December where patient came in, was here for approx 4 days and then stayed at [facility] for about a week and a half. Per linda no medication changes were made to the patient at that time. Patient took all of her medications today, except at 1100 instead of 0830 as scheduled and took an extra ativan  today. Pt cooperative at bedside

## 2024-02-17 NOTE — ED Notes (Signed)
 Pt sister got all pt belongs with her to the house except her pink glasses

## 2024-02-17 NOTE — ED Triage Notes (Signed)
 Hx of bipolar/PTSD. Pt started having altered mental status a few days ago. POA attempted to contact psychiatrist, but never heard back. Pt states she has not slept in a few days, feels like she cannot rest. Denies SI/HI.

## 2024-02-17 NOTE — ED Notes (Signed)
 Pt said she needed to have a bowel movement but is unable to do so because she has not had any stool-softeners.

## 2024-02-17 NOTE — ED Notes (Signed)
 Pt went to the restroom and was instructed on giving a urine sample. She verbalized understanding. She finished using the restroom and knocked on the door, from the inside. This medic instructed her to open the door and she said she needed someone she could trust. I told her I was a paramedic working in the ED today, and she said she did not know if she could trust me. Her visitor approached the door and pt stated she felt safe. She exited the restroom and stated she did not know where the urine was. Pt stated forgot where she put it.

## 2024-02-17 NOTE — ED Notes (Signed)
 ASKED A PT FOR A URINE SAMPLE SHE GAVE ME BOWEL MOVEMENT AND ROLLED IT UP IN TISSUE 6 LITTLE BALLS OF TISSUE AND BM

## 2024-02-17 NOTE — ED Notes (Addendum)
 Pt visitor [name]) took belongings.

## 2024-02-17 NOTE — ED Provider Notes (Signed)
 [place] EMERGENCY DEPARTMENT AT [facility] Provider Note   CSN: [id number] Arrival date & time: 02/17/24  1738     History  Chief Complaint  Patient presents with   Psychiatric Evaluation    Brandi Kerr is a 63 y.o. female who presents the emergency department for psychiatric evaluation.  Patient brought in by friend/roommate.  She states that patient normally has very stable bipolar disorder.  For the past 48 hours she states that patient has been "all over the place".  She had a period of depression, and now is experiencing symptoms of mania.  Reports that patient was at [facility] facility back in December and she is not sure what medication changes she may have had, but does not believe there were any.  She states that patient has been taking all her prescribed medications at the most part as scheduled.  She did take an additional Ativan  today her friend request due to increased anxiety.  Friend reports patient has not slept in the past 2 days.   On my exam patient states that she is "not ready to go home yet".  She adamantly denies suicidal or homicidal ideation.  Denies visual or auditory hallucinations.  She did complain of some abdominal pain to a previous provider, but did not complain of this to me.  She does complain that she has been more anxious.  States that she wants to love everyone and does not want to harm anyone, and feels very safe at the place that she is living which "she is only permitted to stay until midnight".  HPI     Home Medications Prior to Admission medications   Medication Sig Start Date End Date Taking? Authorizing Provider  amLODipine  (NORVASC ) 10 MG tablet Take 1 tablet (10 mg total) by mouth daily. 07/19/23  Yes Adra Alanis, FNP  famotidine  (PEPCID ) 20 MG tablet Take 20 mg by mouth daily before breakfast.   Yes [provider]  lamoTRIgine  (LAMICTAL ) 200 MG tablet Take 200 mg by mouth at bedtime. 04/01/18  Yes [provider]  LORazepam  (ATIVAN ) 0.5 MG tablet Take 0.5 mg by mouth 2 (two) times daily as needed for anxiety or sleep.   Yes [provider]  losartan  (COZAAR ) 100 MG tablet Take 1 tablet (100 mg total) by mouth daily. 07/19/23  Yes Adra Alanis, FNP  meloxicam  (MOBIC ) 15 MG tablet Take 1 tablet (15 mg total) by mouth every Monday, Tuesday, Wednesday, Thursday, and Friday. DO NOT TAKE ON SATURDAYS OR SUNDAYS Patient taking differently: Take 15 mg by mouth daily as needed for pain. 11/25/23  Yes Adra Alanis, FNP  pantoprazole  (PROTONIX ) 40 MG tablet TAKE 1 TABLET 30 MINUTES BEFORE BREAKFAST AND THEN 30 MINUTES BEFORE DINNER AS DIRECTED Patient taking differently: Take 40 mg by mouth 2 (two) times daily as needed (for reflux). 09/12/23  Yes Adra Alanis, FNP  QUEtiapine  (SEROQUEL ) 200 MG tablet Take 200 mg by mouth at bedtime. 10/18/22  Yes [provider]  rosuvastatin  (CRESTOR ) 20 MG tablet Take 1 tablet (20 mg total) by mouth daily. 04/12/23  Yes Adra Alanis, FNP  Emollient (LUBRIDERM SERIOUSLY SENSITIVE) LOTN Apply 1 Application topically 2 (two) times daily.    [provider]  REFRESH OPTIVE ADVANCED PF 0.5-1-0.5 % SOLN Place 1 drop into both eyes daily.    [provider]      Allergies    Duloxetine  hcl, Escitalopram , Tramadol  hcl, Venlafaxine, Atorvastatin calcium , Chocolate, Lithium,  Other, Prednisone, and Caffeine    Review of Systems   Review of Systems  Psychiatric/Behavioral:  Positive for behavioral problems and sleep disturbance. The patient is nervous/anxious and is hyperactive.   All other systems reviewed and are negative.   Physical Exam Updated Vital Signs BP (!) 142/97 (BP Location: Left Arm)   Pulse (!) 106   Temp 98.4 F (36.9 C) (Oral)   Resp 17   Ht 5\' 7"  (1.702 m)   Wt 95.8 kg   SpO2 94%   BMI 33.09 kg/m  Physical Exam Vitals and nursing note reviewed.  Constitutional:       Appearance: Normal appearance.  HENT:     Head: Normocephalic and atraumatic.  Eyes:     Conjunctiva/sclera: Conjunctivae normal.  Pulmonary:     Effort: Pulmonary effort is normal. No respiratory distress.  Skin:    General: Skin is warm and dry.  Neurological:     Mental Status: She is alert.  Psychiatric:        Attention and Perception: She is inattentive. She does not perceive auditory or visual hallucinations.        Mood and Affect: Mood normal. Affect is labile.        Speech: Speech is rapid and pressured and tangential.        Behavior: Behavior normal.        Thought Content: Thought content does not include homicidal or suicidal ideation.     Comments: Patient intermittently shouting, singing, and crying     ED Results / Procedures / Treatments   Labs (all labs ordered are listed, but only abnormal results are displayed) Labs Reviewed  COMPREHENSIVE METABOLIC PANEL WITH GFR - Abnormal; Notable for the following components:      Result Value   Potassium 2.9 (*)    Glucose, Bld 112 (*)    Creatinine, Ser 1.54 (*)    GFR, Estimated 38 (*)    All other components within normal limits  RAPID URINE DRUG SCREEN, HOSP PERFORMED - Abnormal; Notable for the following components:   Tetrahydrocannabinol POSITIVE (*)    All other components within normal limits  CBC WITH DIFFERENTIAL/PLATELET - Abnormal; Notable for the following components:   WBC 13.5 (*)    RBC 5.14 (*)    Lymphs Abs 4.7 (*)    Monocytes Absolute 1.3 (*)    All other components within normal limits  ETHANOL  MAGNESIUM    EKG EKG Interpretation Date/Time:  Friday February 17 2024 19:02:23 EDT Ventricular Rate:  82 PR Interval:  156 QRS Duration:  92 QT Interval:  402 QTC Calculation: 469 R Axis:   -13  Text Interpretation: Normal sinus rhythm Normal ECG No significant change since last tracing Confirmed by Celesta Coke ([id number]) on 02/17/2024 7:19:17 PM  Radiology No results  found.  Procedures Procedures    Medications Ordered in ED Medications  amLODipine  (NORVASC ) tablet 10 mg (has no administration in time range)  famotidine  (PEPCID ) tablet 20 mg (has no administration in time range)  lamoTRIgine  (LAMICTAL ) tablet 200 mg (200 mg Oral Given 02/17/24 2219)  LORazepam  (ATIVAN ) tablet 0.5 mg (has no administration in time range)  losartan  (COZAAR ) tablet 100 mg (has no administration in time range)  pantoprazole  (PROTONIX ) EC tablet 40 mg (has no administration in time range)  QUEtiapine  (SEROQUEL ) tablet 200 mg (200 mg Oral Given 02/17/24 2219)  polyvinyl alcohol  (LIQUIFILM TEARS) 1.4 % ophthalmic solution 1 drop (1 drop Both Eyes Patient Refused/Not Given 02/17/24 2224)  rosuvastatin  (CRESTOR ) tablet 20 mg (has no administration in time range)  potassium chloride  (KLOR-CON ) packet 60 mEq (has no administration in time range)    ED Course/ Medical Decision Making/ A&P                                 Medical Decision Making Amount and/or Complexity of Data Reviewed Labs: ordered.  Patient is a 63 y.o. female  who presents to the emergency department for psychiatric complaint.  Past Medical History: Anxiety, bipolar disorder, fibromyalgia, GERD, hyperlipidemia, hypertension, PTSD  Physical Exam: Initially recorded tachycardic, but on my exam patient with normal vital signs.  Patient appears somewhat manic with rapid and pressured, tangential speech.  Denies homicidal or suicidal ideation.  Denies AVH.  Labs: Medical clearance labs ordered, with following pertinent results: Doing 13.5, potassium 2.9, creatinine mildly elevated compared to prior.  Negative ethanol.  UDS positive for THC.  Normal magnesium.  Cardiac monitoring: EKG obtained and interpreted by attending physician which shows: NSR  Medications: I ordered medication including potassium replacement for hypokalemia, also reordered home medications. I have reviewed the patients home  medicines and have made adjustments as needed.  Disposition: Patient is otherwise medically cleared at this time pending medical clearance laboratory evaluation. Will consult TTS and appreciate their recommendations. Patient is voluntary at this time.  Final Clinical Impression(s) / ED Diagnoses Final diagnoses:  Psychiatric disturbance  Hypokalemia    Rx / DC Orders ED Discharge Orders     None      Portions of this report may have been transcribed using voice recognition software. Every effort was made to ensure accuracy; however, inadvertent computerized transcription errors may be present.    Angelee Bahr T, PA-C 02/17/24 2333    Kingsley, Victoria K, DO 02/17/24 2336

## 2024-02-17 NOTE — ED Notes (Signed)
 Family updated as to patient's status.

## 2024-02-18 ENCOUNTER — Encounter (HOSPITAL_COMMUNITY): Payer: Self-pay | Admitting: Psychiatry

## 2024-02-18 DIAGNOSIS — F5105 Insomnia due to other mental disorder: Secondary | ICD-10-CM

## 2024-02-18 DIAGNOSIS — F39 Unspecified mood [affective] disorder: Secondary | ICD-10-CM | POA: Diagnosis present

## 2024-02-18 DIAGNOSIS — F3110 Bipolar disorder, current episode manic without psychotic features, unspecified: Secondary | ICD-10-CM | POA: Diagnosis present

## 2024-02-18 DIAGNOSIS — F3112 Bipolar disorder, current episode manic without psychotic features, moderate: Secondary | ICD-10-CM

## 2024-02-18 NOTE — Consult Note (Addendum)
 [facility] Psychiatric Consult Initial  Patient Name: .Brandi Kerr  MRN: [id number]  DOB: May 30, 1961  Consult Order details:  Orders (From admission, onward)     Start     Ordered   02/17/24 2025  CONSULT TO CALL ACT TEAM       Ordering Provider: Leva Rayas, PA-C  Provider:  (Not yet assigned)  Question:  Reason for Consult?  Answer:  Psych consult   02/17/24 2024             Mode of Visit: In person    Psychiatry Consult Evaluation  Service Date: February 18, 2024 LOS:  LOS: 0 days  Chief Complaint Manic symptoms, Hyperactive behavior  Primary Psychiatric Diagnoses  Bipolar affective disorder, Manic, Mild symptom 2.  Cannabis induced Manic symptom/Mood disorder  Assessment  Brandi Kerr is a 63 y.o. female admitted: Presented to the EDfor 02/17/2024  5:41 PM for Manic symptoms, Hyperactive behavior. She carries the psychiatric diagnoses of PTSD, Bipolar disorder and has a past medical history of  Fibromyalgia, GERD, HTN, Osteoarthritis.   Her current presentation of Manic symptom  is most consistent with Cannabis use-Delta 8 as she started having these experience after first trial.. She meets criteria for discharge home as she is stable and back to base line this morning based on assessment by POA and evaluation by this provider.  Current outpatient psychotropic medications include Lamictal , Seroquel  and historically she has had a positive response to these medications. She was  compliant with medications prior to admission as evidenced by her report and report from POA who is in the same home with patient.. On initial examination, patient was calm, cooperative and remorseful of using . Please see plan below for detailed recommendations.   Diagnoses:  Active Hospital problems: Principal Problem:   Bipolar affective disorder, current episode manic without psychotic symptoms (HCC) Active Problems:   Cannabis-induced mood disorder (HCC)    Plan   ## Psychiatric  Medication Recommendations:  Continue home Medications, Utilize Outpatient Psychiatrist as scheduled.  Avoid illicit drug use-Delta 8  ## Medical Decision Making Capacity: Not specifically addressed in this encounter  ## Further Work-up:  -- - most recent EKG on 02/17/2024 had QtC of 469 -- Pertinent labwork reviewed earlier this admission includes: CBC, CMP, UDS   ## Disposition:-- There are no psychiatric contraindications to discharge at this time  ## Behavioral / Environmental: - No specific recommendations at this time.     ## Safety and Observation Level:  - Based on my clinical evaluation, I estimate the patient to be at NO risk of self harm in the current setting. - At this time, we recommend  routine. This decision is based on my review of the chart including patient's history and current presentation, interview of the patient, mental status examination, and consideration of suicide risk including evaluating suicidal ideation, plan, intent, suicidal or self-harm behaviors, risk factors, and protective factors. This judgment is based on our ability to directly address suicide risk, implement suicide prevention strategies, and develop a safety plan while the patient is in the clinical setting. Please contact our team if there is a concern that risk level has changed.  CSSR Risk Category:C-SSRS RISK CATEGORY: No Risk  Suicide Risk Assessment: Patient has following modifiable risk factors for suicide: recklessness, which we are addressing by Advising patient not to use Delta 8 or any other illicit drug.  Continue taking prescribed Psychotropic . Patient has following non-modifiable or demographic risk factors for suicide: history of  suicide attempt and psychiatric hospitalization Patient has the following protective factors against suicide: Supportive family and Supportive friends  Thank you for this consult request. Recommendations have been communicated to the primary team.  We will  Psychiatrically clear patient for discharge home. at this time.   Brandi Pichardo C Faiga Stones, NP-PMHNP-BC       History of Present Illness  Relevant Aspects of Hospital ED Course:  Admitted on 02/17/2024 for Manic symptoms, Hyperactive behavior  Patient IS 63 years old brought in to the er by her POA who she live with as well for Altered Mental status for few days.  Patient did not sleep for few days, was not eating as well.  Patient has Psychiatry hx of Bipolar disorder and PTSD and she sees a Therapist, sports at [employer] [facility] Psych[facility] vis Telepsych.  Patient was seen last night by [facility] who recommended a reevaluation this morning.  Patient was seen by the this provider awake, alert and oriented x4.  She was tearful but engaged in meaningful conversation.  Patient admitted no sleep for three days and states she was almost manic.  She reported she remembers doing so many thing but not completing any.  She said she tried to reach her Psychiatrist but could not get them.  Patient is very knowledgeable about her Mental illness and Medications.  She also states she does not miss appointments or Medications.  She then added she used "Delta 8 Cannabis " for the first time.  She admits she believes this is what caused her manic symptoms.  Patient reported this is the first time of using this cannabis and promises not to use it again.  UDS is positive for Cannabis. Patient denies SI/HI/AVH but stated that she OD once in 2010.  She was hospitalized at [facility]  last December from stress caused by her husband.  Patient does not meet criteria for inpatient Psychiatry hospitalization.  She has a home to go back to and she lives with her POA who visited this morning and agrees that patient can come home with her.  Patient is Psychiatrically cleared.   Psych ROS:  Depression: yes Anxiety:  na Mania (lifetime and current): na Psychosis: (lifetime and current): na  Collateral information:  Contacted Minus Amel   [facility]) who wanted to see patient before allowing her home.  [name] stated that patient is very much compliant with her Medications but for three days she has not slept.  She added patient was manic and doing so much without stopping.  She said she tried to contact her Psychiatrist but did not get any response.  [name] came in and saw patient.  She happy that after good night sleep patient is at base line.  She also was surprised that patient started using Delta 8 which she said will not happen again.  Patient is Psychiatrically cleared.  Review of Systems  All other systems reviewed and are negative.    Psychiatric and Social History  Psychiatric History:  Information collected from patient/POA  Prev Dx/Sx: See above Current Psych Provider: [facility] Home Meds (current): see above Previous Med Trials: unknown Therapy: yes  [facility] Feathers  Prior Psych Hospitalization: yes  Prior Self Harm: yes-od long timer ago Prior Violence: denies  Family Psych History: denies Family Hx suicide: denies  Social History:  Developmental Hx: wnl Educational Hx: Some collage Occupational Hx: IT  Legal Hx: denies Living Situation: home with POA Spiritual Hx: Denies Access to weapons/lethal means: Denies  Substance  History Alcohol : denies  Tobacco: Denies Illicit drugs: Delta 8 cannabis Prescription drug abuse: denies Rehab hx: denies  Exam Findings  Physical Exam:  Vital Signs:  Temp:  [98.1 F (36.7 C)-98.4 F (36.9 C)] 98.1 F (36.7 C) (04/19 0943) Pulse Rate:  [87-106] 87 (04/19 0943) Resp:  [17-18] 17 (04/19 0943) BP: (103-142)/(67-97) 131/84 (04/19 0943) SpO2:  [94 %-100 %] 100 % (04/19 0943) Weight:  [95.8 kg] 95.8 kg (04/18 1754) Blood pressure 131/84, pulse 87, temperature 98.1 F (36.7 C), resp. rate 17, height 5\' 7"  (1.702 m), weight 95.8 kg, SpO2 100%. Body mass index is 33.09 kg/m.  Physical Exam Vitals and nursing note reviewed.  HENT:      Nose: Nose normal.  Cardiovascular:     Rate and Rhythm: Normal rate and regular rhythm.  Pulmonary:     Effort: Pulmonary effort is normal.  Skin:    General: Skin is dry.  Neurological:     Mental Status: She is oriented to person, place, and time.  Psychiatric:        Attention and Perception: Attention and perception normal.        Mood and Affect: Mood normal.        Speech: Speech normal.        Behavior: Behavior normal. Behavior is cooperative.        Thought Content: Thought content normal.        Cognition and Memory: Cognition and memory normal.        Judgment: Judgment normal.     Mental Status Exam: General Appearance: Casual and Neat  Orientation:  Full (Time, Place, and Person)  Memory:  Immediate;   Good Recent;   Good Remote;   Good  Concentration:  Concentration: Good and Attention Span: Good  Recall:  Good  Attention  Good  Eye Contact:  Good  Speech:  Clear and Coherent  Language:  Good  Volume:  Normal  Mood: "ok, I got some good sleep"  Affect:  Congruent  Thought Process:  Coherent and Goal Directed  Thought Content:  Logical  Suicidal Thoughts:  No  Homicidal Thoughts:  No  Judgement:  Fair  Insight:  Good  Psychomotor Activity:  Normal  Akathisia:  NA  Fund of Knowledge:  Good      Assets:  Communication Skills Desire for Improvement Housing Intimacy Social Support  Cognition:  WNL  ADL's:  Intact  AIMS (if indicated):        Other History   These have been pulled in through the EMR, reviewed, and updated if appropriate.  Family History:  The patient's family history is not on file.  Medical History: Past Medical History:  Diagnosis Date   Anxiety    Bipolar disorder (HCC)    Degenerative disc disease at L5-S1 level    Fibromyalgia    GERD (gastroesophageal reflux disease)    Hiatal hernia    High cholesterol    HTN (hypertension)    PTSD (post-traumatic stress disorder)    Sciatica     Surgical History: History  reviewed. No pertinent surgical history.   Medications:   Current Facility-Administered Medications:    amLODipine  (NORVASC ) tablet 10 mg, 10 mg, Oral, Daily, Roemhildt, Lorin T, PA-C, 10 mg at 02/18/24 0942   famotidine  (PEPCID ) tablet 20 mg, 20 mg, Oral, QAC breakfast, Roemhildt, Lorin T, PA-C, 20 mg at 02/18/24 0943   lamoTRIgine  (LAMICTAL ) tablet 200 mg, 200 mg, Oral, QHS, Roemhildt, Lorin T, PA-C, 200 mg at  02/17/24 2219   LORazepam  (ATIVAN ) tablet 0.5 mg, 0.5 mg, Oral, BID PRN, Roemhildt, Lorin T, PA-C   losartan  (COZAAR ) tablet 100 mg, 100 mg, Oral, Daily, Roemhildt, Lorin T, PA-C, 100 mg at 02/18/24 0942   pantoprazole  (PROTONIX ) EC tablet 40 mg, 40 mg, Oral, BID PRN, Roemhildt, Lorin T, PA-C   polyvinyl alcohol  (LIQUIFILM TEARS) 1.4 % ophthalmic solution 1 drop, 1 drop, Both Eyes, Daily, Roemhildt, Lorin T, PA-C   QUEtiapine  (SEROQUEL ) tablet 200 mg, 200 mg, Oral, QHS, Roemhildt, Lorin T, PA-C, 200 mg at 02/17/24 2219   rosuvastatin  (CRESTOR ) tablet 20 mg, 20 mg, Oral, Daily, Roemhildt, Lorin T, PA-C, 20 mg at 02/18/24 0943  Current Outpatient Medications:    amLODipine  (NORVASC ) 10 MG tablet, Take 1 tablet (10 mg total) by mouth daily., Disp: 90 tablet, Rfl: 3   famotidine  (PEPCID ) 20 MG tablet, Take 20 mg by mouth daily before breakfast., Disp: , Rfl:    lamoTRIgine  (LAMICTAL ) 200 MG tablet, Take 200 mg by mouth at bedtime., Disp: , Rfl:    LORazepam  (ATIVAN ) 0.5 MG tablet, Take 0.5 mg by mouth 2 (two) times daily as needed for anxiety or sleep., Disp: , Rfl:    losartan  (COZAAR ) 100 MG tablet, Take 1 tablet (100 mg total) by mouth daily., Disp: 90 tablet, Rfl: 3   meloxicam  (MOBIC ) 15 MG tablet, Take 1 tablet (15 mg total) by mouth every Monday, Tuesday, Wednesday, Thursday, and Friday. DO NOT TAKE ON SATURDAYS OR SUNDAYS (Patient taking differently: Take 15 mg by mouth daily as needed for pain.), Disp: 90 tablet, Rfl: 0   pantoprazole  (PROTONIX ) 40 MG tablet, TAKE 1 TABLET 30  MINUTES BEFORE BREAKFAST AND THEN 30 MINUTES BEFORE DINNER AS DIRECTED (Patient taking differently: Take 40 mg by mouth 2 (two) times daily as needed (for reflux).), Disp: 180 tablet, Rfl: 1   QUEtiapine  (SEROQUEL ) 200 MG tablet, Take 200 mg by mouth at bedtime., Disp: , Rfl:    rosuvastatin  (CRESTOR ) 20 MG tablet, Take 1 tablet (20 mg total) by mouth daily., Disp: 90 tablet, Rfl: 3   Emollient (LUBRIDERM SERIOUSLY SENSITIVE) LOTN, Apply 1 Application topically 2 (two) times daily., Disp: , Rfl:    REFRESH OPTIVE ADVANCED PF 0.5-1-0.5 % SOLN, Place 1 drop into both eyes daily., Disp: , Rfl:   Allergies: Allergies  Allergen Reactions   Duloxetine  Hcl Other (See Comments)    Seizures   Escitalopram  Other (See Comments)    NO LEXAPRO  AT THIS TIME   Tramadol  Hcl Other (See Comments)    Seizures    Venlafaxine Other (See Comments) and Hypertension    TOO HIGH OF A BLOOD PRESSURE ALMOST HAD A STROKE   Atorvastatin Calcium  Other (See Comments)    Prefers to not take this- wants only Crestor    Chocolate Nausea And Vomiting and Other (See Comments)    White chocolate only   Lithium Other (See Comments)    "I do not allow Lithium"   Other Other (See Comments)    "Do not ever give me Electroconvulsive therapy"   Prednisone Other (See Comments)    Labile mood and "not healthy for me"   Caffeine Anxiety and Other (See Comments)    Alfreida Inches, NP

## 2024-02-18 NOTE — ED Notes (Signed)
 Visitor at bedside.

## 2024-02-18 NOTE — ED Provider Notes (Signed)
 Emergency Medicine Observation Re-evaluation Note  Brandi Kerr is a 63 y.o. female, seen on rounds today.  Pt initially presented to the ED for complaints of Psychiatric Evaluation Currently, the patient is resting.  Physical Exam  BP 103/67 (BP Location: Left Arm)   Pulse 91   Temp 98.4 F (36.9 C) (Oral)   Resp 18   Ht 5\' 7"  (1.702 m)   Wt 95.8 kg   SpO2 96%   BMI 33.09 kg/m  Physical Exam General: NAD  ED Course / MDM  EKG:EKG Interpretation Date/Time:  Friday February 17 2024 19:02:23 EDT Ventricular Rate:  82 PR Interval:  156 QRS Duration:  92 QT Interval:  402 QTC Calculation: 469 R Axis:   -13  Text Interpretation: Normal sinus rhythm Normal ECG No significant change since last tracing Confirmed by Celesta Coke ([id number]) on 02/17/2024 7:19:17 PM  I have reviewed the labs performed to date as well as medications administered while in observation.  Recent changes in the last 24 hours include no acute events reported.  Plan  Current plan is for psych re-evaluation this AM (tele psych completed last night).    Burnette Carte, MD 02/18/24 [phone]

## 2024-02-18 NOTE — Consult Note (Signed)
 [facility] Consult Note  Patient Name: Brandi Kerr MRN: [id number] DOB: June 16, 1961 DATE OF Consult: 02/18/2024  PRIMARY PSYCHIATRIC DIAGNOSES  1.  Bipolar mania 2.  Insomnia 3.    RECOMMENDATIONS  Recommendations: Medication recommendations: Please give another dose of Seroquel  200 mg po now for bipolar Non-Medication/therapeutic recommendations: Patient really needs sleep to reverse this manic episode and slide into exacerbation of her bipolar disorder. With extra Seroquel  it will help sleep and mood stabilization Is inpatient psychiatric hospitalization recommended for this patient? No (Explain why): patient will need reassessed in the morning. Per chart review she can clear if she gets some sleep. If she does not clear then she should be hospitalized. Follow-Up Telepsychiatry C/L services: We will continue to follow this patient with you until stabilized or discharged.  If you have any questions or concerns, please call our TeleCare Coordination service at  [phone] and ask for myself or the provider on-call. Communication: Treatment team members (and family members if applicable) who were involved in treatment/care discussions and planning, and with whom we spoke or engaged with via secure text/chat, include the following: Epic chat with the team  Thank you for involving us  in the care of this patient. If you have any additional questions or concerns, please call [phone] and ask for me or the provider on-call.  TELEPSYCHIATRY ATTESTATION & CONSENT  As the provider for this telehealth consult, I attest that I verified the patient's identity using two separate identifiers, introduced myself to the patient, provided my credentials, disclosed my location, and performed this encounter via a HIPAA-compliant, real-time, face-to-face, two-way, interactive audio and video platform and with the full consent and agreement of the patient (or guardian as applicable.)  Patient physical  location: [facility]. Telehealth provider physical location: home office in state of [place] .  Video start time: 0035 (Central Time) Video end time: 0045 (Central Time)  IDENTIFYING DATA  Brandi Kerr is a 63 y.o. year-old female for whom a psychiatric consultation has been ordered by the primary provider. The patient was identified using two separate identifiers.  CHIEF COMPLAINT/REASON FOR CONSULT  Manic behavior  HISTORY OF PRESENT ILLNESS (HPI)  The patient per the ED provider note, "Patient brought in by friend/roommate. She states that patient normally has very stable bipolar disorder. For the past 48 hours she states that patient has been "all over the place". She had a period of depression, and now is experiencing symptoms of mania."  The patient had received her night meds about 2-3 hours before this interview. She was tired but easily woke up, sat up and conversed. She does not want to go into the psych hospital if not absolutely necessary. She is currently A/O x3. She acknowledges that it is the lack of sleep that will set off the mania. She has never had a higher dose of the Seroquel  and is agreeable to trying an extra dose tonight to make sure she sleeps and it is a mood stabilizer so will help to mitigate the mania she was exhibiting earlier.  She denis SI/HI/AVH. Aaron Aas    PAST PSYCHIATRIC HISTORY  Yes she has had hospitalizations. She remembers the last one was las December. One suicide attempt a long time ago. Has been taking her medications as prescribed. Lamictal  200 mg at hs and Seroquel  200 mg at hs Positive history of PTSD Otherwise as per HPI above.  PAST MEDICAL HISTORY  Past Medical History:  Diagnosis Date   Anxiety    Bipolar disorder (HCC)  Degenerative disc disease at L5-S1 level    Fibromyalgia    GERD (gastroesophageal reflux disease)    Hiatal hernia    High cholesterol    HTN (hypertension)    PTSD (post-traumatic stress disorder)    Sciatica      HOME  MEDICATIONS  Facility Ordered Medications  Medication   amLODipine  (NORVASC ) tablet 10 mg   famotidine  (PEPCID ) tablet 20 mg   lamoTRIgine  (LAMICTAL ) tablet 200 mg   LORazepam  (ATIVAN ) tablet 0.5 mg   losartan  (COZAAR ) tablet 100 mg   pantoprazole  (PROTONIX ) EC tablet 40 mg   QUEtiapine  (SEROQUEL ) tablet 200 mg   polyvinyl alcohol  (LIQUIFILM TEARS) 1.4 % ophthalmic solution 1 drop   rosuvastatin  (CRESTOR ) tablet 20 mg   [COMPLETED] potassium chloride  (KLOR-CON ) packet 60 mEq   PTA Medications  Medication Sig   lamoTRIgine  (LAMICTAL ) 200 MG tablet Take 200 mg by mouth at bedtime.   LORazepam  (ATIVAN ) 0.5 MG tablet Take 0.5 mg by mouth 2 (two) times daily as needed for anxiety or sleep.   QUEtiapine  (SEROQUEL ) 200 MG tablet Take 200 mg by mouth at bedtime.   rosuvastatin  (CRESTOR ) 20 MG tablet Take 1 tablet (20 mg total) by mouth daily.   amLODipine  (NORVASC ) 10 MG tablet Take 1 tablet (10 mg total) by mouth daily.   losartan  (COZAAR ) 100 MG tablet Take 1 tablet (100 mg total) by mouth daily.   pantoprazole  (PROTONIX ) 40 MG tablet TAKE 1 TABLET 30 MINUTES BEFORE BREAKFAST AND THEN 30 MINUTES BEFORE DINNER AS DIRECTED (Patient taking differently: Take 40 mg by mouth 2 (two) times daily as needed (for reflux).)   meloxicam  (MOBIC ) 15 MG tablet Take 1 tablet (15 mg total) by mouth every Monday, Tuesday, Wednesday, Thursday, and Friday. DO NOT TAKE ON SATURDAYS OR SUNDAYS (Patient taking differently: Take 15 mg by mouth daily as needed for pain.)   Emollient (LUBRIDERM SERIOUSLY SENSITIVE) LOTN Apply 1 Application topically 2 (two) times daily.   REFRESH OPTIVE ADVANCED PF 0.5-1-0.5 % SOLN Place 1 drop into both eyes daily.     ALLERGIES  Allergies  Allergen Reactions   Duloxetine  Hcl Other (See Comments)    Seizures   Escitalopram  Other (See Comments)    NO LEXAPRO  AT THIS TIME   Tramadol  Hcl Other (See Comments)    Seizures    Venlafaxine Other (See Comments) and Hypertension     TOO HIGH OF A BLOOD PRESSURE ALMOST HAD A STROKE   Atorvastatin Calcium  Other (See Comments)    Prefers to not take this- wants only Crestor    Chocolate Nausea And Vomiting and Other (See Comments)    White chocolate only   Lithium Other (See Comments)    "I do not allow Lithium"   Other Other (See Comments)    "Do not ever give me Electroconvulsive therapy"   Prednisone Other (See Comments)    Labile mood and "not healthy for me"   Caffeine Anxiety and Other (See Comments)    SOCIAL & SUBSTANCE USE HISTORY  Social History   Socioeconomic History   Marital status: Divorced    Spouse name: Not on file   Number of children: Not on file   Years of education: Not on file   Highest education level: 12th grade  Occupational History   Not on file  Tobacco Use   Smoking status: Never   Smokeless tobacco: Never  Substance and Sexual Activity   Alcohol  use: Not Currently   Drug use: Not Currently  Types: Marijuana   Sexual activity: Not on file  Other Topics Concern   Not on file  Social History Narrative   Not on file   Social Drivers of Health   Financial Resource Strain: Low Risk  (11/24/2023)   Overall Financial Resource Strain (CARDIA)    Difficulty of Paying Living Expenses: Not hard at all  Food Insecurity: No Food Insecurity (11/24/2023)   Hunger Vital Sign    Worried About Running Out of Food in the Last Year: Never true    Ran Out of Food in the Last Year: Never true  Transportation Needs: No Transportation Needs (11/24/2023)   PRAPARE - Administrator, Civil Service (Medical): No    Lack of Transportation (Non-Medical): No  Physical Activity: Insufficiently Active (11/24/2023)   Exercise Vital Sign    Days of Exercise per Week: 1 day    Minutes of Exercise per Session: 10 min  Stress: No Stress Concern Present (11/24/2023)   [employer] of Occupational Health - Occupational Stress Questionnaire    Feeling of Stress : Not at all  Social  Connections: Moderately Isolated (11/24/2023)   Social Connection and Isolation Panel [NHANES]    Frequency of Communication with Friends and Family: More than three times a week    Frequency of Social Gatherings with Friends and Family: More than three times a week    Attends Religious Services: 1 to 4 times per year    Active Member of [employer] or Organizations: No    Attends Banker Meetings: Never    Marital Status: Divorced   Social History   Tobacco Use  Smoking Status Never  Smokeless Tobacco Never   Social History   Substance and Sexual Activity  Alcohol  Use Not Currently   Social History   Substance and Sexual Activity  Drug Use Not Currently   Types: Marijuana    Additional pertinent information .  FAMILY HISTORY  History reviewed. No pertinent family history. Family Psychiatric History (if known):  unknown  MENTAL STATUS EXAM (MSE)  Mental Status Exam: General Appearance: WNL  Orientation:  Full (Time, Place, and Person)  Memory:   difficult to assess  Concentration:  Concentration: Poor and Attention Span: Poor  Recall:  Fair  Attention  Poor  Eye Contact:   trying hard to stay awake  Speech:  Clear and Coherent  Language:  Fair  Volume:  Decreased  Mood: euthymic  Affect:  Blunt  Thought Process:  Linear at the moment  Thought Content:   appears to be within normal limits at the moment  Suicidal Thoughts:  No  Homicidal Thoughts:  No  Judgement:  Fair  Insight:  Fair  Psychomotor Activity:  Normal  Akathisia:  No  Fund of Knowledge:   unable to assess    Assets:  Housing Social Support  Cognition:  WNL  ADL's:  Intact  AIMS (if indicated):       VITALS  Blood pressure (!) 142/97, pulse (!) 106, temperature 98.4 F (36.9 C), temperature source Oral, resp. rate 17, height 5\' 7"  (1.702 m), weight 95.8 kg, SpO2 94%.  LABS  Admission on 02/17/2024  Component Date Value Ref Range Status   Sodium 02/17/2024 140  135 - 145 mmol/L Final    Potassium 02/17/2024 2.9 (L)  3.5 - 5.1 mmol/L Final   Chloride 02/17/2024 106  98 - 111 mmol/L Final   CO2 02/17/2024 22  22 - 32 mmol/L Final   Glucose, Bld 02/17/2024  112 (H)  70 - 99 mg/dL Final   Glucose reference range applies only to samples taken after fasting for at least 8 hours.   BUN 02/17/2024 15  8 - 23 mg/dL Final   Creatinine, Ser 02/17/2024 1.54 (H)  0.44 - 1.00 mg/dL Final   Calcium  02/17/2024 9.5  8.9 - 10.3 mg/dL Final   Total Protein 16/08/9603 7.0  6.5 - 8.1 g/dL Final   Albumin 54/07/8118 4.1  3.5 - 5.0 g/dL Final   AST 14/78/2956 24  15 - 41 U/L Final   ALT 02/17/2024 20  0 - 44 U/L Final   Alkaline Phosphatase 02/17/2024 65  38 - 126 U/L Final   Total Bilirubin 02/17/2024 1.0  0.0 - 1.2 mg/dL Final   GFR, Estimated 02/17/2024 38 (L)  >60 mL/min Final   Comment: (NOTE) Calculated using the CKD-EPI Creatinine Equation (2021)    Anion gap 02/17/2024 12  5 - 15 Final   Performed at [facility], [address] [place], [place] [address]   Alcohol , Ethyl (B) 02/17/2024 <10  <10 mg/dL Final   Comment: (NOTE) For medical purposes only. Performed at [facility], [address] [place], [place] [address]    Opiates 02/17/2024 NONE DETECTED  NONE DETECTED Final   Cocaine 02/17/2024 NONE DETECTED  NONE DETECTED Final   Benzodiazepines 02/17/2024 NONE DETECTED  NONE DETECTED Final   Amphetamines 02/17/2024 NONE DETECTED  NONE DETECTED Final   Tetrahydrocannabinol 02/17/2024 POSITIVE (A)  NONE DETECTED Final   Barbiturates 02/17/2024 NONE DETECTED  NONE DETECTED Final   Comment: (NOTE) DRUG SCREEN FOR MEDICAL PURPOSES ONLY.  IF CONFIRMATION IS NEEDED FOR ANY PURPOSE, NOTIFY LAB WITHIN 5 DAYS.  LOWEST DETECTABLE LIMITS FOR URINE DRUG SCREEN Drug Class                     Cutoff (ng/mL) Amphetamine and metabolites    1000 Barbiturate and metabolites    200 Benzodiazepine                 200 Opiates and metabolites         300 Cocaine and metabolites        300 THC                            50 Performed at [facility], [address] [place], [place] [address]    WBC 02/17/2024 13.5 (H)  4.0 - 10.5 K/uL Final   RBC 02/17/2024 5.14 (H)  3.87 - 5.11 MIL/uL Final   Hemoglobin 02/17/2024 14.2  12.0 - 15.0 g/dL Final   HCT 52/84/1324 44.6  36.0 - 46.0 % Final   MCV 02/17/2024 86.8  80.0 - 100.0 fL Final   MCH 02/17/2024 27.6  26.0 - 34.0 pg Final   MCHC 02/17/2024 31.8  30.0 - 36.0 g/dL Final   RDW 40/08/2724 13.6  11.5 - 15.5 % Final   Platelets 02/17/2024 339  150 - 400 K/uL Final   nRBC 02/17/2024 0.0  0.0 - 0.2 % Final   Neutrophils Relative % 02/17/2024 55  % Final   Neutro Abs 02/17/2024 7.4  1.7 - 7.7 K/uL Final   Lymphocytes Relative 02/17/2024 35  % Final   Lymphs Abs 02/17/2024 4.7 (H)  0.7 - 4.0 K/uL Final   Monocytes Relative 02/17/2024 9  % Final   Monocytes Absolute 02/17/2024 1.3 (H)  0.1 - 1.0  K/uL Final   Eosinophils Relative 02/17/2024 0  % Final   Eosinophils Absolute 02/17/2024 0.1  0.0 - 0.5 K/uL Final   Basophils Relative 02/17/2024 1  % Final   Basophils Absolute 02/17/2024 0.1  0.0 - 0.1 K/uL Final   Immature Granulocytes 02/17/2024 0  % Final   Abs Immature Granulocytes 02/17/2024 0.03  0.00 - 0.07 K/uL Final   Performed at [facility], [address], [place], [place] [address]   Magnesium 02/17/2024 1.9  1.7 - 2.4 mg/dL Final   Performed at [facility], [address] [place], [place] [address]    PSYCHIATRIC REVIEW OF SYSTEMS (ROS)  ROS: Notable for the following relevant positive findings: ROS  Additional findings:      Musculoskeletal: No abnormal movements observed      Gait & Station: Laying/Sitting      Pain Screening: Denies      Nutrition & Dental Concerns:   no concerns  RISK FORMULATION/ASSESSMENT  Is the patient experiencing any suicidal or homicidal ideations: No    Protective factors considered  for safety management: supportive friend/roommate  Risk factors/concerns considered for safety management:  Prior attempt  Is there a safety management plan with the patient and treatment team to minimize risk factors and promote protective factors: Yes           Explain: observation and medication Is crisis care placement or psychiatric hospitalization recommended: No     Based on my current evaluation and risk assessment, patient is determined at this time to be at:  Moderate Risk  *RISK ASSESSMENT Risk assessment is a dynamic process; it is possible that this patient's condition, and risk level, may change. This should be re-evaluated and managed over time as appropriate. Please re-consult psychiatric consult services if additional assistance is needed in terms of risk assessment and management. If your team decides to discharge this patient, please advise the patient how to best access emergency psychiatric services, or to call 911, if their condition worsens or they feel unsafe in any way.   Alexandria Shiflett A Katessa Attridge, NP Telepsychiatry Consult Services

## 2024-02-18 NOTE — Discharge Instructions (Signed)
 Return for any problem.  ?

## 2024-02-22 ENCOUNTER — Other Ambulatory Visit: Payer: Self-pay | Admitting: Family

## 2024-02-24 ENCOUNTER — Encounter: Payer: Self-pay | Admitting: Family

## 2024-02-24 ENCOUNTER — Ambulatory Visit (INDEPENDENT_AMBULATORY_CARE_PROVIDER_SITE_OTHER): Admitting: Family

## 2024-02-24 VITALS — BP 130/78 | HR 94 | Ht 67.0 in | Wt 213.2 lb

## 2024-02-24 DIAGNOSIS — F3177 Bipolar disorder, in partial remission, most recent episode mixed: Secondary | ICD-10-CM

## 2024-02-24 DIAGNOSIS — Z8249 Family history of ischemic heart disease and other diseases of the circulatory system: Secondary | ICD-10-CM | POA: Diagnosis not present

## 2024-02-24 DIAGNOSIS — M1991 Primary osteoarthritis, unspecified site: Secondary | ICD-10-CM | POA: Diagnosis not present

## 2024-02-24 DIAGNOSIS — F1990 Other psychoactive substance use, unspecified, uncomplicated: Secondary | ICD-10-CM

## 2024-02-24 DIAGNOSIS — F12988 Cannabis use, unspecified with other cannabis-induced disorder: Secondary | ICD-10-CM | POA: Diagnosis not present

## 2024-02-24 DIAGNOSIS — F39 Unspecified mood [affective] disorder: Secondary | ICD-10-CM | POA: Diagnosis not present

## 2024-02-24 DIAGNOSIS — I10 Essential (primary) hypertension: Secondary | ICD-10-CM | POA: Diagnosis not present

## 2024-02-24 NOTE — Progress Notes (Signed)
 Brandi Kerr is a 63 y.o. 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   Delusional disorder (HCC) 10/07/2022   Bipolar affective disorder, currently depressed, moderate (HCC) 11/18/2021   Gastroesophageal reflux disease 11/18/2021   Hiatal hernia 11/18/2021   Primary osteoarthritis 11/18/2021   Fibromyalgia 11/18/2021   Hypertensive urgency 04/27/2018   HTN (hypertension) 04/27/2018   Bipolar disorder (HCC) 04/27/2018   Seizure-like activity (HCC) 04/27/2018    Current Outpatient Medications  Medication Sig Dispense Refill   amLODipine  (NORVASC ) 10 MG tablet Take 1 tablet (10 mg total) by mouth daily. 90 tablet 3   Emollient (LUBRIDERM SERIOUSLY SENSITIVE) LOTN Apply 1 Application topically 2 (two) times daily.     famotidine  (PEPCID ) 20 MG tablet Take 20 mg by mouth daily before breakfast.     lamoTRIgine  (LAMICTAL ) 200 MG tablet Take 200 mg by mouth at bedtime.     LORazepam  (ATIVAN ) 0.5 MG tablet Take 0.5 mg by mouth 2 (two) times daily as needed for anxiety or sleep.     losartan  (COZAAR ) 100 MG tablet Take 1 tablet (100 mg total) by mouth daily. 90 tablet 3   meloxicam  (MOBIC ) 15 MG tablet TAKE 1 TABLET (15 MG TOTAL) BY MOUTH EVERY MONDAY, TUESDAY, WEDNESDAY, THURSDAY, AND FRIDAY. DO NOT TAKE ON SATURDAYS OR SUNDAYS 90 tablet 0   pantoprazole  (PROTONIX ) 40 MG tablet TAKE 1 TABLET 30 MINUTES BEFORE BREAKFAST AND THEN 30 MINUTES BEFORE DINNER AS DIRECTED (Patient taking differently: Take 40 mg by mouth 2 (two) times daily as needed (for reflux).) 180 tablet 1   QUEtiapine  (SEROQUEL ) 200 MG tablet Take 200 mg by mouth at bedtime.     REFRESH OPTIVE ADVANCED PF 0.5-1-0.5 % SOLN Place 1 drop into both eyes daily.     rosuvastatin  (CRESTOR ) 20 MG tablet Take 1 tablet (20 mg total)  by mouth daily. 90 tablet 3   No current facility-administered medications for this visit.    Allergies: Duloxetine  hcl, Escitalopram , Lithium, Tramadol  hcl, Venlafaxine, Atorvastatin calcium , Chocolate, Other, Prednisone, and Caffeine  Past Medical History:  Diagnosis Date   Anxiety    Bipolar disorder (HCC)    Degenerative disc disease at L5-S1 level    Fibromyalgia    GERD (gastroesophageal reflux disease)    Hiatal hernia    High cholesterol    HTN (hypertension)    PTSD (post-traumatic stress disorder)    Sciatica     No past surgical history on file.  No family history on file.  Social History   Tobacco Use   Smoking status: Never   Smokeless tobacco: Never  Substance Use Topics   Alcohol  use: Not Currently    Subjective:   Patient was seen in the ER on 4/18 with psychiatric disturbance/ hypokalemia; she is hoping to get into a 30 day program with [facility] ( residential inpatient facility in [place]);  patient had been using Delta 8 to help manage pain; she readily admits that the Delta 8 was the cause of her recent manic episode and she is still recovering; she has not spoken to her psychiatrist since being seen last week;  Patient is not suicidal/ is actively excited about being placed in the residual program;  She repeatedly mentions how dangerous Delta 8 is and how it should be taken off the market and she will  never take it again; She repeatedly mentions how her mother's doctor was adamant that all of her mother's children get their cardiac needs addressed completely and she wants to find a way to "get her arteries checked out."  Objective:  Vitals:   02/24/24 1308  BP: 130/78  Pulse: 94  SpO2: 94%  Weight: 213 lb 3.2 oz (96.7 kg)  Height: 5\' 7"  (1.702 m)    General: Well developed, well nourished, in no acute distress;  Skin : Warm and dry.  Head: Normocephalic and atraumatic  Eyes: Sclera and conjunctiva clear; pupils round and reactive to light;  extraocular movements intact  Ears: External normal; canals clear; tympanic membranes normal  Oropharynx: Pink, supple. No suspicious lesions  Neck: Supple without thyromegaly, adenopathy  Lungs: Respirations unlabored; clear to auscultation bilaterally without wheeze, rales, rhonchi  CVS exam: normal rate and regular rhythm.  Neurologic: Alert and oriented; speech intact; face symmetrical; moves all extremities well; CNII-XII intact without focal deficit; pressured speech/ manic behavior exhibited;   Assessment:  1. FH: CAD (coronary artery disease)   2. Primary hypertension   3. Drug use   4. Bipolar disorder, in partial remission, most recent episode mixed (HCC)   5. Cannabis-induced mood disorder (HCC)   6. Primary osteoarthritis, unspecified site     Plan:  Rx for Coronary calcium  CT; information provided; patient is excited and interested by the potential of the information that can be gotten with this test; Stable; continue same medications; check CBC, CMP today; Patient is adamant that she will never use Delta 8 again; she asks for a drug test to see if she is still testing positive for marijuana; I did encourage patient to reach out to her psychiatrist to discuss ER evaluation/ anxiety; Order for handicapped placard provided- this is a temporary placard; she understands permanent option needs to be discussed with her orthopedist;  Time spent 45 minutes  No follow-ups on file.  Orders Placed This Encounter  Procedures   CT CARDIAC SCORING (SELF PAY ONLY)    Standing Status:   Future    Expiration Date:   02/23/2025    Preferred imaging location?:   [facility]   CBC with Differential/Platelet   Comp Met (CMET)   Drug Monitoring Panel [id number], Urine    Requested Prescriptions    No prescriptions requested or ordered in this encounter

## 2024-02-25 LAB — CBC WITH DIFFERENTIAL/PLATELET
Absolute Lymphocytes: 3723 {cells}/uL (ref 850–3900)
Absolute Monocytes: 572 {cells}/uL (ref 200–950)
Basophils Absolute: 42 {cells}/uL (ref 0–200)
Basophils Relative: 0.4 %
Eosinophils Absolute: 31 {cells}/uL (ref 15–500)
Eosinophils Relative: 0.3 %
HCT: 40.6 % (ref 35.0–45.0)
Hemoglobin: 13.4 g/dL (ref 11.7–15.5)
MCH: 27.3 pg (ref 27.0–33.0)
MCHC: 33 g/dL (ref 32.0–36.0)
MCV: 82.9 fL (ref 80.0–100.0)
MPV: 10.3 fL (ref 7.5–12.5)
Monocytes Relative: 5.5 %
Neutro Abs: 6032 {cells}/uL (ref 1500–7800)
Neutrophils Relative %: 58 %
Platelets: 336 10*3/uL (ref 140–400)
RBC: 4.9 10*6/uL (ref 3.80–5.10)
RDW: 13.2 % (ref 11.0–15.0)
Total Lymphocyte: 35.8 %
WBC: 10.4 10*3/uL (ref 3.8–10.8)

## 2024-02-25 LAB — COMPREHENSIVE METABOLIC PANEL WITH GFR
AG Ratio: 1.8 (calc) (ref 1.0–2.5)
ALT: 14 U/L (ref 6–29)
AST: 22 U/L (ref 10–35)
Albumin: 4.6 g/dL (ref 3.6–5.1)
Alkaline phosphatase (APISO): 75 U/L (ref 37–153)
BUN/Creatinine Ratio: 13 (calc) (ref 6–22)
BUN: 14 mg/dL (ref 7–25)
CO2: 27 mmol/L (ref 20–32)
Calcium: 9.9 mg/dL (ref 8.6–10.4)
Chloride: 102 mmol/L (ref 98–110)
Creat: 1.06 mg/dL — ABNORMAL HIGH (ref 0.50–1.05)
Globulin: 2.6 g/dL (ref 1.9–3.7)
Glucose, Bld: 94 mg/dL (ref 65–99)
Potassium: 3.9 mmol/L (ref 3.5–5.3)
Sodium: 139 mmol/L (ref 135–146)
Total Bilirubin: 0.4 mg/dL (ref 0.2–1.2)
Total Protein: 7.2 g/dL (ref 6.1–8.1)
eGFR: 59 mL/min/{1.73_m2} — ABNORMAL LOW (ref >=60)

## 2024-02-26 ENCOUNTER — Ambulatory Visit (HOSPITAL_COMMUNITY)
Admission: EM | Admit: 2024-02-26 | Discharge: 2024-02-27 | Disposition: A | Discharge status: Other Acute Inpatient Hospital | Attending: Family Medicine | Admitting: Family Medicine

## 2024-02-26 ENCOUNTER — Other Ambulatory Visit: Payer: Self-pay

## 2024-02-26 DIAGNOSIS — F3110 Bipolar disorder, current episode manic without psychotic features, unspecified: Secondary | ICD-10-CM | POA: Diagnosis present

## 2024-02-26 DIAGNOSIS — Z79899 Other long term (current) drug therapy: Secondary | ICD-10-CM | POA: Diagnosis not present

## 2024-02-26 DIAGNOSIS — I119 Hypertensive heart disease without heart failure: Secondary | ICD-10-CM | POA: Diagnosis not present

## 2024-02-26 DIAGNOSIS — F4310 Post-traumatic stress disorder, unspecified: Secondary | ICD-10-CM | POA: Insufficient documentation

## 2024-02-26 DIAGNOSIS — R4182 Altered mental status, unspecified: Secondary | ICD-10-CM | POA: Insufficient documentation

## 2024-02-26 DIAGNOSIS — F3112 Bipolar disorder, current episode manic without psychotic features, moderate: Secondary | ICD-10-CM

## 2024-02-26 LAB — CBC WITH DIFFERENTIAL/PLATELET
Abs Immature Granulocytes: 0.02 10*3/uL (ref 0.00–0.07)
Basophils Absolute: 0.1 10*3/uL (ref 0.0–0.1)
Basophils Relative: 1 %
Eosinophils Absolute: 0 10*3/uL (ref 0.0–0.5)
Eosinophils Relative: 0 %
HCT: 46.6 % — ABNORMAL HIGH (ref 36.0–46.0)
Hemoglobin: 15.5 g/dL — ABNORMAL HIGH (ref 12.0–15.0)
Immature Granulocytes: 0 %
Lymphocytes Relative: 42 %
Lymphs Abs: 4.4 10*3/uL — ABNORMAL HIGH (ref 0.7–4.0)
MCH: 27.8 pg (ref 26.0–34.0)
MCHC: 33.3 g/dL (ref 30.0–36.0)
MCV: 83.7 fL (ref 80.0–100.0)
Monocytes Absolute: 0.6 10*3/uL (ref 0.1–1.0)
Monocytes Relative: 6 %
Neutro Abs: 5.4 10*3/uL (ref 1.7–7.7)
Neutrophils Relative %: 51 %
Platelets: 337 10*3/uL (ref 150–400)
RBC: 5.57 MIL/uL — ABNORMAL HIGH (ref 3.87–5.11)
RDW: 13.5 % (ref 11.5–15.5)
WBC: 10.5 10*3/uL (ref 4.0–10.5)
nRBC: 0 % (ref 0.0–0.2)

## 2024-02-26 LAB — URINALYSIS, ROUTINE W REFLEX MICROSCOPIC
Bilirubin Urine: NEGATIVE
Glucose, UA: NEGATIVE mg/dL
Hgb urine dipstick: NEGATIVE
Ketones, ur: 5 mg/dL — AB
Nitrite: NEGATIVE
Protein, ur: 30 mg/dL — AB
Specific Gravity, Urine: 1.018 (ref 1.005–1.030)
pH: 5 (ref 5.0–8.0)

## 2024-02-26 LAB — POCT URINE DRUG SCREEN - MANUAL ENTRY (I-SCREEN)
POC Amphetamine UR: POSITIVE — AB
POC Buprenorphine (BUP): NOT DETECTED
POC Cocaine UR: NOT DETECTED
POC Marijuana UR: POSITIVE — AB
POC Methadone UR: NOT DETECTED
POC Methamphetamine UR: NOT DETECTED
POC Morphine: POSITIVE — AB
POC Oxazepam (BZO): NOT DETECTED
POC Oxycodone UR: NOT DETECTED
POC Secobarbital (BAR): NOT DETECTED

## 2024-02-26 LAB — COMPREHENSIVE METABOLIC PANEL WITH GFR
ALT: 24 U/L (ref 0–44)
AST: 30 U/L (ref 15–41)
Albumin: 5 g/dL (ref 3.5–5.0)
Alkaline Phosphatase: 77 U/L (ref 38–126)
Anion gap: 18 — ABNORMAL HIGH (ref 5–15)
BUN: 11 mg/dL (ref 8–23)
CO2: 21 mmol/L — ABNORMAL LOW (ref 22–32)
Calcium: 10.3 mg/dL (ref 8.9–10.3)
Chloride: 101 mmol/L (ref 98–111)
Creatinine, Ser: 1.1 mg/dL — ABNORMAL HIGH (ref 0.44–1.00)
GFR, Estimated: 56 mL/min — ABNORMAL LOW (ref >60)
Glucose, Bld: 106 mg/dL — ABNORMAL HIGH (ref 70–99)
Potassium: 3.4 mmol/L — ABNORMAL LOW (ref 3.5–5.1)
Sodium: 140 mmol/L (ref 135–145)
Total Bilirubin: 0.8 mg/dL (ref 0.0–1.2)
Total Protein: 8.5 g/dL — ABNORMAL HIGH (ref 6.5–8.1)

## 2024-02-26 LAB — TSH: TSH: 1.969 u[IU]/mL (ref 0.350–4.500)

## 2024-02-26 LAB — LIPID PANEL
Cholesterol: 209 mg/dL — ABNORMAL HIGH (ref 0–200)
HDL: 61 mg/dL (ref >40)
LDL Cholesterol: 121 mg/dL — ABNORMAL HIGH (ref 0–99)
Total CHOL/HDL Ratio: 3.4 ratio
Triglycerides: 134 mg/dL (ref <150)
VLDL: 27 mg/dL (ref 0–40)

## 2024-02-26 LAB — ETHANOL: Alcohol, Ethyl (B): <15 mg/dL (ref <15)

## 2024-02-26 LAB — HEMOGLOBIN A1C
Hgb A1c MFr Bld: 5.7 % — ABNORMAL HIGH (ref 4.8–5.6)
Mean Plasma Glucose: 116.89 mg/dL

## 2024-02-26 MED ORDER — AMLODIPINE BESYLATE 10 MG PO TABS: 10.0000 mg | ORAL_TABLET | Freq: Once | ORAL | Status: DC

## 2024-02-26 MED ORDER — LOSARTAN POTASSIUM 50 MG PO TABS: 100.0000 mg | ORAL_TABLET | Freq: Every day | ORAL | Status: DC

## 2024-02-26 MED ORDER — ACETAMINOPHEN 325 MG PO TABS: 650.0000 mg | ORAL_TABLET | Freq: Four times a day (QID) | ORAL | Status: DC | PRN

## 2024-02-26 MED ORDER — ALUM & MAG HYDROXIDE-SIMETH 200-200-20 MG/5ML PO SUSP: 30.0000 mL | ORAL | Status: DC | PRN

## 2024-02-26 MED ORDER — OLANZAPINE 5 MG PO TBDP: 5.0000 mg | ORAL_TABLET | Freq: Three times a day (TID) | ORAL | Status: DC | PRN

## 2024-02-26 MED ORDER — ROSUVASTATIN CALCIUM 20 MG PO TABS: 20.0000 mg | ORAL_TABLET | Freq: Every day | ORAL | Status: DC

## 2024-02-26 MED ORDER — QUETIAPINE FUMARATE ER 300 MG PO TB24
300.0000 mg | ORAL_TABLET | Freq: Every day | ORAL | Status: DC
  Administered 2024-02-26: 300 mg via ORAL
  Filled 2024-02-26: qty 1

## 2024-02-26 MED ORDER — OLANZAPINE 10 MG IM SOLR: 5.0000 mg | Freq: Three times a day (TID) | INTRAMUSCULAR | Status: DC | PRN

## 2024-02-26 MED ORDER — LAMOTRIGINE 100 MG PO TABS
200.0000 mg | ORAL_TABLET | Freq: Every day | ORAL | Status: DC
Start: 2024-02-26 — End: 2024-02-27
  Administered 2024-02-26: 200 mg via ORAL
  Filled 2024-02-26: qty 2

## 2024-02-26 MED ORDER — MAGNESIUM HYDROXIDE 400 MG/5ML PO SUSP: 30.0000 mL | Freq: Every day | ORAL | Status: DC | PRN

## 2024-02-26 NOTE — ED Notes (Signed)
 Voluntary consent completed with pt. Pt verbalized understanding. Pt is aware that she will be taken to [facility] tmr morning. All questions answered, no further concerns.

## 2024-02-26 NOTE — ED Notes (Addendum)
 RN made NP Alois Arnt aware of UA results
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
## 2024-02-26 NOTE — ED Notes (Signed)
 Salad given d/t dinner being late. RN has called dietary twice to ask about dinner, states it will be here by 1900.
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
## 2024-02-26 NOTE — ED Notes (Signed)
 Dinner given, no s/sx of distress. No further concerns.

## 2024-02-26 NOTE — Progress Notes (Signed)
 [facility]/BMU LCSW Progress Note   02/26/2024    6:00 PM  Brandi Kerr   [id number]   Type of Contact and Topic:  Psychiatric Bed Placement   Pt accepted to [facility]   Patient meets inpatient criteria per Addie Holstein, NP  The attending provider will be Dr. Reinhold Carbine   Call report to [phone]  Debbra Fairy, RN @ [facility] notified.     Pt scheduled  to arrive at [facility] for tomorrow.    Brandi Kerr, MSW, LCSW-A  6:01 PM 02/26/2024

## 2024-02-26 NOTE — Progress Notes (Signed)
 Patient has been denied by [facility] and has been faxed out. Patient meets [facility] inpatient criteria per Addie Holstein, NP. Patient has been faxed out to the following facilities:   [facility] [address] [place] [place] [address] [phone]  [facility] [address], [place] [address] [phone]  [facility] [address] [place] [address] [phone]  [facility] [address] [place], [place] [address] [phone]  [facility] Patient Placement [facility], [place] [phone]  [facility] [address] [place] [address] [phone]  [facility] [address] [place] [address] [phone]  [facility] [address] [place] [address] [phone]  [facility] Josephina Nicks [place] [address] [phone]  [facility] [address] [place] [address] [phone]  [facility] EFAX [address], [place] [phone]  [facility] [address] Johnella Naas [place] [address] [phone]  [facility] [address], [place] [address] [phone]  [facility] [address] [place] [address] [phone]  [facility] [address] [place], [place] [address] [phone]  [facility] [address] [place] [address] [phone]  [facility] [address] [place] [address] [phone]  [facility] [address] [place] [address] [phone]  [facility] [address] [place] [address] [phone]   Phares Brasher, MSW, LCSW-A  5:42 PM 02/26/2024

## 2024-02-26 NOTE — ED Notes (Signed)
 The patient is sitting in the recliner, watching television, and socializing with other pts. No acute distress noted. Environment is secured. Will continue to monitor for safety.

## 2024-02-26 NOTE — ED Notes (Signed)
 Pt brought onto unit, bed 3. She is calm and cooperative at this time, though very hyperactive and restless. She is paranoid and exhibiting delusional, disorganized thinking. She is unable to focus on one subject, she rambles between multiple. She is alert & oriented x3, though has no comprehension of reality. Denies SI, HI or AVH- though AVH is suspected. RN offered food, pt denied wanting anything. She is ciurrently sitting in bed & watching tv.

## 2024-02-27 ENCOUNTER — Telehealth (HOSPITAL_COMMUNITY): Payer: Self-pay | Admitting: Licensed Clinical Social Worker

## 2024-02-27 ENCOUNTER — Encounter: Payer: Self-pay | Admitting: Family

## 2024-02-27 DIAGNOSIS — E785 Hyperlipidemia, unspecified: Secondary | ICD-10-CM | POA: Diagnosis not present

## 2024-02-27 DIAGNOSIS — Z91148 Patient's other noncompliance with medication regimen for other reason: Secondary | ICD-10-CM | POA: Diagnosis not present

## 2024-02-27 DIAGNOSIS — Z9151 Personal history of suicidal behavior: Secondary | ICD-10-CM | POA: Diagnosis not present

## 2024-02-27 DIAGNOSIS — F250 Schizoaffective disorder, bipolar type: Secondary | ICD-10-CM | POA: Diagnosis not present

## 2024-02-27 DIAGNOSIS — N390 Urinary tract infection, site not specified: Secondary | ICD-10-CM | POA: Diagnosis not present

## 2024-02-27 DIAGNOSIS — I119 Hypertensive heart disease without heart failure: Secondary | ICD-10-CM | POA: Diagnosis not present

## 2024-02-27 DIAGNOSIS — R4182 Altered mental status, unspecified: Secondary | ICD-10-CM | POA: Diagnosis not present

## 2024-02-27 DIAGNOSIS — F4310 Post-traumatic stress disorder, unspecified: Secondary | ICD-10-CM | POA: Diagnosis not present

## 2024-02-27 DIAGNOSIS — Z79899 Other long term (current) drug therapy: Secondary | ICD-10-CM | POA: Diagnosis not present

## 2024-02-27 DIAGNOSIS — F319 Bipolar disorder, unspecified: Secondary | ICD-10-CM | POA: Diagnosis not present

## 2024-02-27 DIAGNOSIS — F1220 Cannabis dependence, uncomplicated: Secondary | ICD-10-CM | POA: Diagnosis not present

## 2024-02-27 DIAGNOSIS — K219 Gastro-esophageal reflux disease without esophagitis: Secondary | ICD-10-CM | POA: Diagnosis not present

## 2024-02-27 DIAGNOSIS — I10 Essential (primary) hypertension: Secondary | ICD-10-CM | POA: Diagnosis not present

## 2024-02-27 DIAGNOSIS — F3112 Bipolar disorder, current episode manic without psychotic features, moderate: Secondary | ICD-10-CM | POA: Diagnosis not present

## 2024-02-27 LAB — DRUG MONITORING PANEL 375977 , URINE
Alcohol Metabolites: NEGATIVE ng/mL (ref <500)
Alphahydroxyalprazolam: NEGATIVE ng/mL (ref <25)
Alphahydroxymidazolam: NEGATIVE ng/mL (ref <50)
Alphahydroxytriazolam: NEGATIVE ng/mL (ref <50)
Aminoclonazepam: NEGATIVE ng/mL (ref <25)
Amphetamines: NEGATIVE ng/mL (ref <500)
Barbiturates: NEGATIVE ng/mL (ref <300)
Benzodiazepines: POSITIVE ng/mL — AB (ref <100)
Cocaine Metabolite: NEGATIVE ng/mL (ref <150)
Desmethyltramadol: NEGATIVE ng/mL (ref <100)
Hydroxyethylflurazepam: NEGATIVE ng/mL (ref <50)
Lorazepam: 841 ng/mL — ABNORMAL HIGH (ref <50)
Marijuana Metabolite: NEGATIVE ng/mL (ref <20)
Marijuana Metabolite: NEGATIVE ng/mL (ref <5)
Nordiazepam: NEGATIVE ng/mL (ref <50)
Opiates: NEGATIVE ng/mL (ref <100)
Oxazepam: NEGATIVE ng/mL (ref <50)
Oxycodone: NEGATIVE ng/mL (ref <100)
Temazepam: NEGATIVE ng/mL (ref <50)
Tramadol: NEGATIVE ng/mL (ref <100)

## 2024-02-27 LAB — DM TEMPLATE

## 2024-02-27 MED ORDER — QUETIAPINE FUMARATE ER 300 MG PO TB24
300.0000 mg | ORAL_TABLET | Freq: Every day | ORAL | Status: AC
Start: 2024-02-27 — End: ?

## 2024-02-27 NOTE — ED Notes (Signed)
 Report called to Sheryle Donning, RN @ [facility]. SAFE transport called & arranged.

## 2024-02-27 NOTE — ED Notes (Signed)
 Patient observed pacing several times and coming to the nurse station and murmuring at staff. Patient has disorganized speech, tangential  needing constant redirection. Denies SI and HI. Patient observed responding to internal stimuli. Lifting her hands up and saying "I need help now." When asked what kind of help she needs, patient was unable to articulate. Will continue to monitor and maintain safety.

## 2024-02-27 NOTE — ED Provider Notes (Signed)
 FBC/OBS ASAP Discharge Summary  Date and Time: 02/27/2024 1:04 PM  Name: Brandi Kerr  MRN:  [id number]   Discharge Diagnoses:  Final diagnoses:  Bipolar affective disorder, currently manic, moderate (HCC)  HPI.  Brandi Kerr is a 63 year old female who initially presented to [facility] on 02/26/2024 with manic behavior.  She was admitted to the continuous assessment unit while awaiting inpatient psychiatric bed availability.  Patient seen face-to-face by this provider, chart reviewed, case consulted with Dr. Genita Keys 02/27/2024.  Subjective:   Currently on assessment patient is observed sitting in her bed talking to herself.  She is pleasant upon approach.  Her speech is clear, coherent but at a fast pace.  She appears anxious.  She denies depression and has a euphoric affect.  She is disorganized in her thought process and has to be redirected throughout assessment.  She Is tangential.  She is also delusional and paranoid.  She called this Clinical research associate a "Zelot".  She is hyperreligious.  She believes that when she remembers things that she is "God".  She is labile. She initially denies auditory and visual hallucinations but later states that a man talks to her and her head.  She will not elaborate on what this man says.  She often covers her mouth so that other people cannot see or hear her talking.  Stay Summary:   Patient recommended for inpatient psychiatric admission and has been accepted to old [facility] for treatment.  Total Time spent with patient: 20 minutes  Past Psychiatric History: See H&P Past Medical History: See H&P Family History: See H&P Family Psychiatric History: See H&P Social History: See H&P Tobacco Cessation:  N/A, patient does not currently use tobacco products  Current Medications:  Current Facility-Administered Medications  Medication Dose Route Frequency Provider Last Rate Last Admin   acetaminophen (TYLENOL) tablet 650 mg  650 mg Oral Q6H PRN Hobson, Fran E, NP        alum & mag hydroxide-simeth (MAALOX/MYLANTA) 200-200-20 MG/5ML suspension 30 mL  30 mL Oral Q4H PRN Hobson, Fran E, NP       amLODipine  (NORVASC ) tablet 10 mg  10 mg Oral Once Hobson, Fran E, NP       lamoTRIgine  (LAMICTAL ) tablet 200 mg  200 mg Oral QHS Hobson, Fran E, NP   200 mg at 02/26/24 2108   losartan  (COZAAR ) tablet 100 mg  100 mg Oral Daily Hobson, Fran E, NP       magnesium hydroxide (MILK OF MAGNESIA) suspension 30 mL  30 mL Oral Daily PRN Hobson, Fran E, NP       OLANZapine (ZYPREXA) injection 5 mg  5 mg Intramuscular TID PRN Hobson, Fran E, NP       OLANZapine zydis (ZYPREXA) disintegrating tablet 5 mg  5 mg Oral TID PRN Hobson, Fran E, NP       QUEtiapine  (SEROQUEL  XR) 24 hr tablet 300 mg  300 mg Oral QHS Hobson, Fran E, NP   300 mg at 02/26/24 2108   rosuvastatin  (CRESTOR ) tablet 20 mg  20 mg Oral Daily Hobson, Fran E, NP       Current Outpatient Medications  Medication Sig Dispense Refill   amLODipine  (NORVASC ) 10 MG tablet Take 1 tablet (10 mg total) by mouth daily. 90 tablet 3   Emollient (LUBRIDERM SERIOUSLY SENSITIVE) LOTN Apply 1 Application topically 2 (two) times daily.     famotidine  (PEPCID ) 20 MG tablet Take 20 mg by mouth daily before breakfast.  lamoTRIgine  (LAMICTAL ) 200 MG tablet Take 200 mg by mouth at bedtime.     losartan  (COZAAR ) 100 MG tablet Take 1 tablet (100 mg total) by mouth daily. 90 tablet 3   meloxicam  (MOBIC ) 15 MG tablet TAKE 1 TABLET (15 MG TOTAL) BY MOUTH EVERY MONDAY, TUESDAY, WEDNESDAY, THURSDAY, AND FRIDAY. DO NOT TAKE ON SATURDAYS OR SUNDAYS 90 tablet 0   pantoprazole  (PROTONIX ) 40 MG tablet TAKE 1 TABLET 30 MINUTES BEFORE BREAKFAST AND THEN 30 MINUTES BEFORE DINNER AS DIRECTED (Patient taking differently: Take 40 mg by mouth 2 (two) times daily as needed (for reflux).) 180 tablet 1   QUEtiapine  (SEROQUEL  XR) 300 MG 24 hr tablet Take 1 tablet (300 mg total) by mouth at bedtime.     REFRESH OPTIVE ADVANCED PF 0.5-1-0.5 % SOLN Place 1 drop  into both eyes daily.     rosuvastatin  (CRESTOR ) 20 MG tablet Take 1 tablet (20 mg total) by mouth daily. 90 tablet 3    PTA Medications:  PTA Medications  Medication Sig   lamoTRIgine  (LAMICTAL ) 200 MG tablet Take 200 mg by mouth at bedtime.   Emollient (LUBRIDERM SERIOUSLY SENSITIVE) LOTN Apply 1 Application topically 2 (two) times daily.   REFRESH OPTIVE ADVANCED PF 0.5-1-0.5 % SOLN Place 1 drop into both eyes daily.   rosuvastatin  (CRESTOR ) 20 MG tablet Take 1 tablet (20 mg total) by mouth daily.   amLODipine  (NORVASC ) 10 MG tablet Take 1 tablet (10 mg total) by mouth daily.   losartan  (COZAAR ) 100 MG tablet Take 1 tablet (100 mg total) by mouth daily.   pantoprazole  (PROTONIX ) 40 MG tablet TAKE 1 TABLET 30 MINUTES BEFORE BREAKFAST AND THEN 30 MINUTES BEFORE DINNER AS DIRECTED (Patient taking differently: Take 40 mg by mouth 2 (two) times daily as needed (for reflux).)   famotidine  (PEPCID ) 20 MG tablet Take 20 mg by mouth daily before breakfast.   meloxicam  (MOBIC ) 15 MG tablet TAKE 1 TABLET (15 MG TOTAL) BY MOUTH EVERY MONDAY, TUESDAY, WEDNESDAY, THURSDAY, AND FRIDAY. DO NOT TAKE ON SATURDAYS OR SUNDAYS   QUEtiapine  (SEROQUEL  XR) 300 MG 24 hr tablet Take 1 tablet (300 mg total) by mouth at bedtime.   Facility Ordered Medications  Medication   acetaminophen (TYLENOL) tablet 650 mg   alum & mag hydroxide-simeth (MAALOX/MYLANTA) 200-200-20 MG/5ML suspension 30 mL   magnesium hydroxide (MILK OF MAGNESIA) suspension 30 mL   OLANZapine zydis (ZYPREXA) disintegrating tablet 5 mg   OLANZapine (ZYPREXA) injection 5 mg   lamoTRIgine  (LAMICTAL ) tablet 200 mg   QUEtiapine  (SEROQUEL  XR) 24 hr tablet 300 mg   rosuvastatin  (CRESTOR ) tablet 20 mg   amLODipine  (NORVASC ) tablet 10 mg   losartan  (COZAAR ) tablet 100 mg       05/06/2023    3:10 PM 04/12/2023    3:07 PM  Depression screen PHQ 2/9  Decreased Interest 0 0  Down, Depressed, Hopeless 0 0  PHQ - 2 Score 0 0    Flowsheet Row ED from  02/26/2024 in [facility] ED from 02/17/2024 in [facility] Emergency Department at [facility] ED from 10/07/2022 in [facility] Emergency Department at [facility]  C-SSRS RISK CATEGORY No Risk No Risk No Risk       Musculoskeletal  Strength & Muscle Tone: within normal limits Gait & Station: normal Patient leans: N/A  Psychiatric Specialty Exam  Presentation  General Appearance:  Casual  Eye Contact: Fair  Speech: Clear and Coherent; Pressured  Speech Volume: Increased  Handedness:Right   Mood and Affect  Mood: Anxious; Euphoric  Affect: Congruent; Labile   Thought Process  Thought Processes: Disorganized  Descriptions of Associations:Tangential  Orientation:Full (Time, Place and Person)  Thought Content:Paranoid Ideation; Tangential  Diagnosis of Schizophrenia or Schizoaffective disorder in past: No  Duration of Psychotic Symptoms: No data recorded  Hallucinations:Hallucinations: Auditory Description of Auditory Hallucinations: wont elaborate on what voices say  Ideas of Reference:None  Suicidal Thoughts:Suicidal Thoughts: No  Homicidal Thoughts:Homicidal Thoughts: No   Sensorium  Memory: Immediate Fair; Remote Fair; Recent Fair  Judgment: Poor  Insight: Poor   Executive Functions  Concentration: Fair  Attention Span: Fair  Recall: [employer] of Knowledge: Fair  Language: Fair   Psychomotor Activity  Psychomotor Activity: Psychomotor Activity: Restlessness   Assets  Assets: Housing; Leisure Time; Physical Health; Resilience   Sleep  Sleep: Sleep: Poor Number of Hours of Sleep: 0 (did not sleep last night)   Nutritional Assessment (For OBS and FBC admissions only) Has the patient had a weight loss or gain of 10 pounds or more in the last 3 months?: No Has the patient had a decrease in food intake/or appetite?: No Does the patient have dental problems?: No Does the  patient have eating habits or behaviors that may be indicators of an eating disorder including binging or inducing vomiting?: No Has the patient recently lost weight without trying?: 0 Has the patient been eating poorly because of a decreased appetite?: 0 Malnutrition Screening Tool Score: 0    Physical Exam  Physical Exam Constitutional:      Appearance: Normal appearance.  Eyes:     General:        Right eye: No discharge.        Left eye: No discharge.  Cardiovascular:     Rate and Rhythm: Normal rate.  Pulmonary:     Effort: Pulmonary effort is normal. No respiratory distress.  Musculoskeletal:        General: Normal range of motion.     Cervical back: Normal range of motion.  Neurological:     Mental Status: She is alert and oriented to person, place, and time.  Psychiatric:        Attention and Perception: She is inattentive. She perceives auditory hallucinations.        Mood and Affect: Mood is anxious. Affect is labile.        Speech: Speech is rapid and pressured.        Behavior: Behavior is hyperactive.        Thought Content: Thought content is paranoid.        Cognition and Memory: Cognition normal.        Judgment: Judgment is impulsive.    Review of Systems  Constitutional:  Negative for chills and fever.  HENT:  Negative for hearing loss.   Respiratory:  Negative for cough.   Cardiovascular:  Negative for chest pain.  Musculoskeletal: Negative.   Neurological:  Negative for tremors.  Psychiatric/Behavioral:  Positive for hallucinations. The patient is nervous/anxious.    Blood pressure 126/86, pulse 93, temperature 98.5 F (36.9 C), resp. rate 18, SpO2 99%. There is no height or weight on file to calculate BMI.   Disposition: Patient accepted to old [facility] for inpatient psychiatric admission.  Costella Dirks, NP 02/27/2024, 1:04 PM

## 2024-02-27 NOTE — ED Notes (Signed)
 Pt being transported to accepting facility [facility]) now via [facility] transport. All belongings given to transport service. Pt remains stable, calm and cooperative. No s/sx of distress. Any questions regarding transfer answered. All paperwork present in folder, also being given to transport service. No further concerns.

## 2024-02-27 NOTE — Discharge Instructions (Addendum)
 Transfer to [facility] for inpatient [facility] admission

## 2024-02-28 DIAGNOSIS — F319 Bipolar disorder, unspecified: Secondary | ICD-10-CM | POA: Diagnosis not present

## 2024-02-29 DIAGNOSIS — F319 Bipolar disorder, unspecified: Secondary | ICD-10-CM | POA: Diagnosis not present

## 2024-03-01 ENCOUNTER — Telehealth (HOSPITAL_BASED_OUTPATIENT_CLINIC_OR_DEPARTMENT_OTHER): Payer: Self-pay

## 2024-03-01 DIAGNOSIS — F319 Bipolar disorder, unspecified: Secondary | ICD-10-CM | POA: Diagnosis not present

## 2024-03-02 DIAGNOSIS — F319 Bipolar disorder, unspecified: Secondary | ICD-10-CM | POA: Diagnosis not present

## 2024-03-03 DIAGNOSIS — F319 Bipolar disorder, unspecified: Secondary | ICD-10-CM | POA: Diagnosis not present

## 2024-03-04 DIAGNOSIS — F319 Bipolar disorder, unspecified: Secondary | ICD-10-CM | POA: Diagnosis not present

## 2024-03-05 DIAGNOSIS — F319 Bipolar disorder, unspecified: Secondary | ICD-10-CM | POA: Diagnosis not present

## 2024-03-06 ENCOUNTER — Other Ambulatory Visit: Payer: Self-pay | Admitting: Family

## 2024-03-06 DIAGNOSIS — F319 Bipolar disorder, unspecified: Secondary | ICD-10-CM | POA: Diagnosis not present

## 2024-03-07 DIAGNOSIS — F319 Bipolar disorder, unspecified: Secondary | ICD-10-CM | POA: Diagnosis not present

## 2024-03-08 DIAGNOSIS — F319 Bipolar disorder, unspecified: Secondary | ICD-10-CM | POA: Diagnosis not present

## 2024-03-09 DIAGNOSIS — F319 Bipolar disorder, unspecified: Secondary | ICD-10-CM | POA: Diagnosis not present

## 2024-03-10 DIAGNOSIS — F319 Bipolar disorder, unspecified: Secondary | ICD-10-CM | POA: Diagnosis not present

## 2024-03-11 DIAGNOSIS — F319 Bipolar disorder, unspecified: Secondary | ICD-10-CM | POA: Diagnosis not present

## 2024-03-15 ENCOUNTER — Encounter: Payer: Self-pay | Admitting: Pharmacist

## 2024-03-15 ENCOUNTER — Other Ambulatory Visit: Payer: Self-pay | Admitting: Pharmacist

## 2024-03-15 MED ORDER — ROSUVASTATIN CALCIUM 20 MG PO TABS: 20.0000 mg | ORAL_TABLET | Freq: Every day | ORAL | 2 refills | Status: DC

## 2024-03-15 NOTE — Progress Notes (Signed)
 Pharmacy Quality Measure Review  This patient is appearing on a report for being at risk of failing the adherence measure for cholesterol (statin) medications this calendar year.   Medication: rosuvastatin  20mg  Last fill date: 12/15/2023 for 30 day supply  Looks like patient needs an updated refills for rosuvastatin . She was seen last by PCP 02/24/2024  Left voicemail for patient to return my call at their convenience., MyChart message sent to patient., and updated Rx for rosuvastatin  at pharmacy.   Rosuvastatin  20mg  - take 1 tablet daily #90 with 2 refills.   Cecilie Coffee, PharmD Clinical Pharmacist [facility] Primary [facility]  Population Health [phone]

## 2024-03-22 ENCOUNTER — Other Ambulatory Visit: Payer: Self-pay | Admitting: Family

## 2024-03-30 ENCOUNTER — Ambulatory Visit: Admitting: Family

## 2024-04-05 ENCOUNTER — Ambulatory Visit: Admitting: Family

## 2024-04-12 DIAGNOSIS — Z5181 Encounter for therapeutic drug level monitoring: Secondary | ICD-10-CM | POA: Diagnosis not present

## 2024-04-12 DIAGNOSIS — F319 Bipolar disorder, unspecified: Secondary | ICD-10-CM | POA: Diagnosis not present

## 2024-04-12 DIAGNOSIS — F419 Anxiety disorder, unspecified: Secondary | ICD-10-CM | POA: Diagnosis not present

## 2024-04-12 DIAGNOSIS — F4310 Post-traumatic stress disorder, unspecified: Secondary | ICD-10-CM | POA: Diagnosis not present

## 2024-05-09 DIAGNOSIS — F330 Major depressive disorder, recurrent, mild: Secondary | ICD-10-CM | POA: Diagnosis not present

## 2024-05-09 DIAGNOSIS — F900 Attention-deficit hyperactivity disorder, predominantly inattentive type: Secondary | ICD-10-CM | POA: Diagnosis not present

## 2024-05-16 ENCOUNTER — Ambulatory Visit

## 2024-05-16 DIAGNOSIS — F319 Bipolar disorder, unspecified: Secondary | ICD-10-CM | POA: Diagnosis not present

## 2024-05-16 DIAGNOSIS — F4310 Post-traumatic stress disorder, unspecified: Secondary | ICD-10-CM | POA: Diagnosis not present

## 2024-05-16 DIAGNOSIS — F419 Anxiety disorder, unspecified: Secondary | ICD-10-CM | POA: Diagnosis not present

## 2024-05-25 ENCOUNTER — Ambulatory Visit: Payer: Medicare PPO | Admitting: Family

## 2024-05-31 ENCOUNTER — Other Ambulatory Visit: Payer: Self-pay | Admitting: Family

## 2024-06-05 ENCOUNTER — Ambulatory Visit: Payer: Self-pay | Admitting: Family

## 2024-06-05 ENCOUNTER — Ambulatory Visit (INDEPENDENT_AMBULATORY_CARE_PROVIDER_SITE_OTHER): Admitting: Family

## 2024-06-05 ENCOUNTER — Encounter: Payer: Self-pay | Admitting: Family

## 2024-06-05 VITALS — BP 138/86 | HR 68 | Ht 67.0 in | Wt 218.4 lb

## 2024-06-05 DIAGNOSIS — F3177 Bipolar disorder, in partial remission, most recent episode mixed: Secondary | ICD-10-CM | POA: Diagnosis not present

## 2024-06-05 DIAGNOSIS — K219 Gastro-esophageal reflux disease without esophagitis: Secondary | ICD-10-CM | POA: Diagnosis not present

## 2024-06-05 DIAGNOSIS — Z1231 Encounter for screening mammogram for malignant neoplasm of breast: Secondary | ICD-10-CM

## 2024-06-05 DIAGNOSIS — I10 Essential (primary) hypertension: Secondary | ICD-10-CM

## 2024-06-05 LAB — CBC WITH DIFFERENTIAL/PLATELET
Basophils Absolute: 0.1 K/uL (ref 0.0–0.1)
Basophils Relative: 0.9 % (ref 0.0–3.0)
Eosinophils Absolute: 0.1 K/uL (ref 0.0–0.7)
Eosinophils Relative: 1.5 % (ref 0.0–5.0)
HCT: 40.3 % (ref 36.0–46.0)
Hemoglobin: 13 g/dL (ref 12.0–15.0)
Lymphocytes Relative: 48.8 % — ABNORMAL HIGH (ref 12.0–46.0)
Lymphs Abs: 3 K/uL (ref 0.7–4.0)
MCHC: 32.2 g/dL (ref 30.0–36.0)
MCV: 86.2 fl (ref 78.0–100.0)
Monocytes Absolute: 0.4 K/uL (ref 0.1–1.0)
Monocytes Relative: 6.6 % (ref 3.0–12.0)
Neutro Abs: 2.6 K/uL (ref 1.4–7.7)
Neutrophils Relative %: 42.2 % — ABNORMAL LOW (ref 43.0–77.0)
Platelets: 277 K/uL (ref 150.0–400.0)
RBC: 4.67 Mil/uL (ref 3.87–5.11)
RDW: 14.5 % (ref 11.5–15.5)
WBC: 6.2 K/uL (ref 4.0–10.5)

## 2024-06-05 LAB — COMPREHENSIVE METABOLIC PANEL WITH GFR
ALT: 11 U/L (ref 0–35)
AST: 15 U/L (ref 0–37)
Albumin: 4.3 g/dL (ref 3.5–5.2)
Alkaline Phosphatase: 67 U/L (ref 39–117)
BUN: 14 mg/dL (ref 6–23)
CO2: 28 meq/L (ref 19–32)
Calcium: 9.3 mg/dL (ref 8.4–10.5)
Chloride: 104 meq/L (ref 96–112)
Creatinine, Ser: 1 mg/dL (ref 0.40–1.20)
GFR: 60 mL/min — ABNORMAL LOW (ref >60.00)
Glucose, Bld: 97 mg/dL (ref 70–99)
Potassium: 3.6 meq/L (ref 3.5–5.1)
Sodium: 140 meq/L (ref 135–145)
Total Bilirubin: 0.5 mg/dL (ref 0.2–1.2)
Total Protein: 6.9 g/dL (ref 6.0–8.3)

## 2024-06-05 MED ORDER — MELOXICAM 15 MG PO TABS: 15.0000 mg | ORAL_TABLET | ORAL | 0 refills | Status: AC

## 2024-06-05 MED ORDER — LOSARTAN POTASSIUM 100 MG PO TABS: 100.0000 mg | ORAL_TABLET | Freq: Every day | ORAL | 1 refills | Status: AC

## 2024-06-05 MED ORDER — PANTOPRAZOLE SODIUM 40 MG PO TBEC: 40.0000 mg | DELAYED_RELEASE_TABLET | Freq: Two times a day (BID) | ORAL | 1 refills | Status: AC

## 2024-06-05 NOTE — Progress Notes (Signed)
 Brandi Kerr is a 63 y.o. 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   Delusional disorder (HCC) 10/07/2022   Bipolar affective disorder, currently depressed, moderate (HCC) 11/18/2021   Gastroesophageal reflux disease 11/18/2021   Hiatal hernia 11/18/2021   Primary osteoarthritis 11/18/2021   Fibromyalgia 11/18/2021   Hypertensive urgency 04/27/2018   HTN (hypertension) 04/27/2018   Bipolar disorder (HCC) 04/27/2018   Seizure-like activity (HCC) 04/27/2018    Current Outpatient Medications  Medication Sig Dispense Refill   amLODipine  (NORVASC ) 10 MG tablet TAKE 1 TABLET BY MOUTH EVERY DAY 90 tablet 3   clonazePAM (KLONOPIN) 0.5 MG tablet Take 0.5 mg by mouth daily as needed.     Emollient (LUBRIDERM SERIOUSLY SENSITIVE) LOTN Apply 1 Application topically 2 (two) times daily.     lamoTRIgine  (LAMICTAL ) 200 MG tablet Take 200 mg by mouth at bedtime.     QUEtiapine  (SEROQUEL  XR) 300 MG 24 hr tablet Take 1 tablet (300 mg total) by mouth at bedtime.     REFRESH OPTIVE ADVANCED PF 0.5-1-0.5 % SOLN Place 1 drop into both eyes daily.     risperiDONE (RISPERDAL) 1 MG tablet Take 1 mg by mouth at bedtime.     rosuvastatin  (CRESTOR ) 20 MG tablet Take 1 tablet (20 mg total) by mouth daily. 90 tablet 2   losartan  (COZAAR ) 100 MG tablet Take 1 tablet (100 mg total) by mouth daily. 90 tablet 1   meloxicam  (MOBIC ) 15 MG tablet Take 1 tablet (15 mg total) by mouth every Monday, Tuesday, Wednesday, Thursday, and Friday. DO NOT TAKE ON SATURDAYS OR SUNDAYS 90 tablet 0   pantoprazole  (PROTONIX ) 40 MG tablet Take 1 tablet (40 mg total) by mouth 2 (two) times daily before a meal. 180 tablet 1   No current facility-administered medications for this visit.    Allergies: Duloxetine  hcl,  Escitalopram , Lithium, Tramadol  hcl, Venlafaxine, Atorvastatin calcium , Chocolate, Other, Prednisone, and Caffeine  Past Medical History:  Diagnosis Date   Anxiety    Bipolar disorder (HCC)    Degenerative disc disease at L5-S1 level    Fibromyalgia    GERD (gastroesophageal reflux disease)    Hiatal hernia    High cholesterol    HTN (hypertension)    PTSD (post-traumatic stress disorder)    Sciatica     No past surgical history on file.  No family history on file.  Social History   Tobacco Use   Smoking status: Never   Smokeless tobacco: Never  Substance Use Topics   Alcohol  use: Not Currently    Subjective:   4 month follow on chronic care needs; no acute concerns today; does need refills/ labs updated;  Has established with new psychiatrist and doing well;   Objective:  Vitals:   06/05/24 1259  BP: 138/86  Pulse: 68  SpO2: 96%  Weight: 218 lb 6.4 oz (99.1 kg)  Height: 5' 7 (1.702 m)    General: Well developed, well nourished, in no acute distress  Skin : Warm and dry.  Head: Normocephalic and atraumatic  Eyes: Sclera and conjunctiva clear; pupils round and reactive to light; extraocular movements intact  Ears: External normal; canals clear; tympanic membranes normal  Oropharynx: Pink, supple. No suspicious lesions  Neck: Supple without thyromegaly, adenopathy  Lungs: Respirations unlabored; clear to auscultation bilaterally without wheeze, rales, rhonchi  CVS exam: normal rate and regular rhythm.  Neurologic: Alert and oriented; speech intact; face symmetrical; moves all extremities well; CNII-XII intact without focal deficit   Assessment:  1. Primary hypertension   2. Gastroesophageal reflux disease, unspecified whether esophagitis present   3. Visit for screening mammogram   4. Bipolar disorder, in partial remission, most recent episode mixed (HCC)     Plan:  Stable; refill updated; Stable; refill updated; Order updated; Stable; under the care of  psychiatrist;   No follow-ups on file.  Orders Placed This Encounter  Procedures   MM Digital Screening    Standing Status:   Future    Expiration Date:   06/05/2025    Reason for Exam (SYMPTOM  OR DIAGNOSIS REQUIRED):   screening mammogram    Preferred imaging location?:   [facility]   CBC with Differential/Platelet   Comp Met (CMET)    Requested Prescriptions   Signed Prescriptions Disp Refills   losartan  (COZAAR ) 100 MG tablet 90 tablet 1    Sig: Take 1 tablet (100 mg total) by mouth daily.   pantoprazole  (PROTONIX ) 40 MG tablet 180 tablet 1    Sig: Take 1 tablet (40 mg total) by mouth 2 (two) times daily before a meal.   meloxicam  (MOBIC ) 15 MG tablet 90 tablet 0    Sig: Take 1 tablet (15 mg total) by mouth every Monday, Tuesday, Wednesday, Thursday, and Friday. DO NOT TAKE ON SATURDAYS OR SUNDAYS

## 2024-06-06 DIAGNOSIS — F3189 Other bipolar disorder: Secondary | ICD-10-CM | POA: Diagnosis not present

## 2024-06-20 DIAGNOSIS — Z1389 Encounter for screening for other disorder: Secondary | ICD-10-CM | POA: Diagnosis not present

## 2024-06-20 DIAGNOSIS — F419 Anxiety disorder, unspecified: Secondary | ICD-10-CM | POA: Diagnosis not present

## 2024-06-20 DIAGNOSIS — F4310 Post-traumatic stress disorder, unspecified: Secondary | ICD-10-CM | POA: Diagnosis not present

## 2024-06-20 DIAGNOSIS — F319 Bipolar disorder, unspecified: Secondary | ICD-10-CM | POA: Diagnosis not present

## 2024-07-03 ENCOUNTER — Inpatient Hospital Stay (HOSPITAL_BASED_OUTPATIENT_CLINIC_OR_DEPARTMENT_OTHER): Admission: RE | Admit: 2024-07-03 | Source: Ambulatory Visit

## 2024-07-10 DIAGNOSIS — F419 Anxiety disorder, unspecified: Secondary | ICD-10-CM | POA: Diagnosis not present

## 2024-07-10 DIAGNOSIS — F4310 Post-traumatic stress disorder, unspecified: Secondary | ICD-10-CM | POA: Diagnosis not present

## 2024-07-10 DIAGNOSIS — F3189 Other bipolar disorder: Secondary | ICD-10-CM | POA: Diagnosis not present

## 2024-07-26 DIAGNOSIS — F4312 Post-traumatic stress disorder, chronic: Secondary | ICD-10-CM | POA: Diagnosis not present

## 2024-07-26 DIAGNOSIS — F319 Bipolar disorder, unspecified: Secondary | ICD-10-CM | POA: Diagnosis not present

## 2024-07-30 ENCOUNTER — Encounter (HOSPITAL_BASED_OUTPATIENT_CLINIC_OR_DEPARTMENT_OTHER): Payer: Self-pay

## 2024-07-30 ENCOUNTER — Ambulatory Visit (HOSPITAL_BASED_OUTPATIENT_CLINIC_OR_DEPARTMENT_OTHER)
Admission: RE | Admit: 2024-07-30 | Discharge: 2024-07-30 | Disposition: A | Discharge status: Home / Self Care | Source: Ambulatory Visit | Attending: Family | Admitting: Family

## 2024-07-30 DIAGNOSIS — Z1231 Encounter for screening mammogram for malignant neoplasm of breast: Secondary | ICD-10-CM | POA: Insufficient documentation

## 2024-07-31 ENCOUNTER — Ambulatory Visit

## 2024-08-07 ENCOUNTER — Ambulatory Visit

## 2024-08-07 ENCOUNTER — Telehealth: Payer: Self-pay

## 2024-08-07 NOTE — Telephone Encounter (Signed)
 Unsuccessful attempts to reach patient on preferred number listed in notes for scheduled AWV. Left message on voicemail okay to reschedule.

## 2024-08-08 NOTE — Telephone Encounter (Signed)
 His encounter was created an error please disregard.

## 2024-09-03 ENCOUNTER — Encounter: Payer: Self-pay | Admitting: Radiology

## 2024-09-20 ENCOUNTER — Encounter (HOSPITAL_COMMUNITY): Payer: Self-pay

## 2024-09-20 ENCOUNTER — Emergency Department (HOSPITAL_COMMUNITY)
Admission: EM | Admit: 2024-09-20 | Discharge: 2024-09-20 | Disposition: A | Discharge status: Home / Self Care | Attending: Emergency Medicine | Admitting: Emergency Medicine

## 2024-09-20 DIAGNOSIS — D72829 Elevated white blood cell count, unspecified: Secondary | ICD-10-CM | POA: Diagnosis not present

## 2024-09-20 DIAGNOSIS — R1011 Right upper quadrant pain: Secondary | ICD-10-CM | POA: Diagnosis not present

## 2024-09-20 DIAGNOSIS — R112 Nausea with vomiting, unspecified: Secondary | ICD-10-CM | POA: Insufficient documentation

## 2024-09-20 DIAGNOSIS — R1013 Epigastric pain: Secondary | ICD-10-CM | POA: Diagnosis not present

## 2024-09-20 LAB — CBC WITH DIFFERENTIAL/PLATELET
Abs Immature Granulocytes: 0.05 K/uL (ref 0.00–0.07)
Basophils Absolute: 0.1 K/uL (ref 0.0–0.1)
Basophils Relative: 0 %
Eosinophils Absolute: 0 K/uL (ref 0.0–0.5)
Eosinophils Relative: 0 %
HCT: 43.2 % (ref 36.0–46.0)
Hemoglobin: 13.9 g/dL (ref 12.0–15.0)
Immature Granulocytes: 0 %
Lymphocytes Relative: 13 %
Lymphs Abs: 1.8 K/uL (ref 0.7–4.0)
MCH: 27.6 pg (ref 26.0–34.0)
MCHC: 32.2 g/dL (ref 30.0–36.0)
MCV: 85.9 fL (ref 80.0–100.0)
Monocytes Absolute: 0.6 K/uL (ref 0.1–1.0)
Monocytes Relative: 4 %
Neutro Abs: 11.6 K/uL — ABNORMAL HIGH (ref 1.7–7.7)
Neutrophils Relative %: 83 %
Platelets: 295 K/uL (ref 150–400)
RBC: 5.03 MIL/uL (ref 3.87–5.11)
RDW: 13.5 % (ref 11.5–15.5)
WBC: 14.1 K/uL — ABNORMAL HIGH (ref 4.0–10.5)
nRBC: 0 % (ref 0.0–0.2)

## 2024-09-20 LAB — COMPREHENSIVE METABOLIC PANEL WITH GFR
ALT: 18 U/L (ref 0–44)
AST: 26 U/L (ref 15–41)
Albumin: 4.6 g/dL (ref 3.5–5.0)
Alkaline Phosphatase: 96 U/L (ref 38–126)
Anion gap: 13 (ref 5–15)
BUN: 24 mg/dL — ABNORMAL HIGH (ref 8–23)
CO2: 21 mmol/L — ABNORMAL LOW (ref 22–32)
Calcium: 9.5 mg/dL (ref 8.9–10.3)
Chloride: 105 mmol/L (ref 98–111)
Creatinine, Ser: 1.05 mg/dL — ABNORMAL HIGH (ref 0.44–1.00)
GFR, Estimated: 59 mL/min — ABNORMAL LOW (ref >60)
Glucose, Bld: 160 mg/dL — ABNORMAL HIGH (ref 70–99)
Potassium: 4 mmol/L (ref 3.5–5.1)
Sodium: 140 mmol/L (ref 135–145)
Total Bilirubin: 0.4 mg/dL (ref 0.0–1.2)
Total Protein: 7.5 g/dL (ref 6.5–8.1)

## 2024-09-20 LAB — LIPASE, BLOOD: Lipase: 41 U/L (ref 11–51)

## 2024-09-20 MED ORDER — METOCLOPRAMIDE HCL 5 MG/ML IJ SOLN
10.0000 mg | Freq: Once | INTRAMUSCULAR | Status: AC
  Administered 2024-09-20: 10 mg via INTRAVENOUS
  Filled 2024-09-20: qty 2

## 2024-09-20 MED ORDER — SODIUM CHLORIDE 0.9 % IV BOLUS
1000.0000 mL | Freq: Once | INTRAVENOUS | Status: AC
  Administered 2024-09-20: 1000 mL via INTRAVENOUS

## 2024-09-20 MED ORDER — ONDANSETRON 4 MG PO TBDP: ORAL_TABLET | ORAL | 0 refills | Status: AC

## 2024-09-20 MED ORDER — ONDANSETRON 4 MG PO TBDP
4.0000 mg | ORAL_TABLET | Freq: Once | ORAL | Status: AC | PRN
  Administered 2024-09-20: 4 mg via ORAL
  Filled 2024-09-20: qty 1

## 2024-09-20 MED ORDER — DIPHENHYDRAMINE HCL 50 MG/ML IJ SOLN
25.0000 mg | Freq: Once | INTRAMUSCULAR | Status: AC
  Administered 2024-09-20: 25 mg via INTRAVENOUS
  Filled 2024-09-20: qty 1

## 2024-09-20 NOTE — ED Provider Notes (Signed)
 [place] EMERGENCY DEPARTMENT AT [facility] Provider Note   CSN: [id number] Arrival date & time: 09/20/24  9297     Patient presents with: Emesis and Abdominal Pain   Brandi Kerr is a 63 y.o. female.   63 yo F with a chief complaints of nausea and vomiting.  Started about 1 AM this morning.  No diarrhea.  Had Jodie Edison last night.  Otherwise denies suspicious food intake.  No recent international travel.  No known sick contacts.  Feels like her hiatal hernia has been aggravated with this.  Some mild pain to the epigastrium and right upper quadrant at times.  No pain currently.  No fevers.   Emesis Associated symptoms: abdominal pain   Abdominal Pain Associated symptoms: vomiting        Prior to Admission medications   Medication Sig Start Date End Date Taking? Authorizing Provider  ondansetron (ZOFRAN-ODT) 4 MG disintegrating tablet 4mg  ODT q4 hours prn nausea/vomit 09/20/24  Yes Sandrina Heaton, DO  amLODipine  (NORVASC ) 10 MG tablet TAKE 1 TABLET BY MOUTH EVERY DAY 05/31/24   Jason Leita Repine, FNP  clonazePAM (KLONOPIN) 0.5 MG tablet Take 0.5 mg by mouth daily as needed. 04/13/24   [provider]  Emollient (LUBRIDERM SERIOUSLY SENSITIVE) LOTN Apply 1 Application topically 2 (two) times daily.    [provider]  lamoTRIgine  (LAMICTAL ) 200 MG tablet Take 200 mg by mouth at bedtime. 04/01/18   [provider]  losartan  (COZAAR ) 100 MG tablet Take 1 tablet (100 mg total) by mouth daily. 06/05/24   Jason Leita Repine, FNP  meloxicam  (MOBIC ) 15 MG tablet Take 1 tablet (15 mg total) by mouth every Monday, Tuesday, Wednesday, Thursday, and Friday. DO NOT TAKE ON SATURDAYS OR SUNDAYS 06/05/24   Jason Leita Repine, FNP  pantoprazole  (PROTONIX ) 40 MG tablet Take 1 tablet (40 mg total) by mouth 2 (two) times daily before a meal. 06/05/24   Jason Leita Repine, FNP  QUEtiapine  (SEROQUEL  XR) 300 MG 24 hr tablet Take 1 tablet (300 mg total) by mouth  at bedtime. 02/27/24   Mardy Elveria DEL, NP  REFRESH OPTIVE ADVANCED PF 0.5-1-0.5 % SOLN Place 1 drop into both eyes daily.    [provider]  risperiDONE (RISPERDAL) 1 MG tablet Take 1 mg by mouth at bedtime. 05/10/24   [provider]  rosuvastatin  (CRESTOR ) 20 MG tablet Take 1 tablet (20 mg total) by mouth daily. 03/15/24   Domenica Harlene LABOR, MD    Allergies: Duloxetine  hcl, Escitalopram , Lithium, Tramadol  hcl, Venlafaxine, Atorvastatin calcium , Chocolate, Other, Prednisone, and Caffeine    Review of Systems  Gastrointestinal:  Positive for abdominal pain and vomiting.    Updated Vital Signs BP 122/72 (BP Location: Left Arm)   Pulse (!) 109   Temp 98.2 F (36.8 C) (Oral)   Resp 18   Ht 5' 7 (1.702 m)   Wt 99.8 kg   SpO2 94%   BMI 34.46 kg/m   Physical Exam Vitals and nursing note reviewed.  Constitutional:      General: She is not in acute distress.    Appearance: She is well-developed. She is not diaphoretic.  HENT:     Head: Normocephalic and atraumatic.  Eyes:     Pupils: Pupils are equal, round, and reactive to light.  Cardiovascular:     Rate and Rhythm: Normal rate and regular rhythm.     Heart sounds: No murmur heard.    No friction rub. No gallop.  Pulmonary:  Effort: Pulmonary effort is normal.     Breath sounds: No wheezing or rales.  Abdominal:     General: There is no distension.     Palpations: Abdomen is soft.     Tenderness: There is no abdominal tenderness.     Comments: No obvious discomfort on abdominal exam.  No pain with deep palpation of the right upper quadrant.  Negative Murphy's.  Musculoskeletal:        General: No tenderness.     Cervical back: Normal range of motion and neck supple.  Skin:    General: Skin is warm and dry.  Neurological:     Mental Status: She is alert and oriented to person, place, and time.  Psychiatric:        Behavior: Behavior normal.     (all labs ordered are listed, but only abnormal  results are displayed) Labs Reviewed  CBC WITH DIFFERENTIAL/PLATELET - Abnormal; Notable for the following components:      Result Value   WBC 14.1 (*)    Neutro Abs 11.6 (*)    All other components within normal limits  COMPREHENSIVE METABOLIC PANEL WITH GFR - Abnormal; Notable for the following components:   CO2 21 (*)    Glucose, Bld 160 (*)    BUN 24 (*)    Creatinine, Ser 1.05 (*)    GFR, Estimated 59 (*)    All other components within normal limits  LIPASE, BLOOD  URINALYSIS, ROUTINE W REFLEX MICROSCOPIC    EKG: None  Radiology: No results found.   Procedures   Medications Ordered in the ED  ondansetron (ZOFRAN-ODT) disintegrating tablet 4 mg (4 mg Oral Given 09/20/24 0721)  metoCLOPramide (REGLAN) injection 10 mg (10 mg Intravenous Given 09/20/24 0808)  diphenhydrAMINE (BENADRYL) injection 25 mg (25 mg Intravenous Given 09/20/24 0805)  sodium chloride  0.9 % bolus 1,000 mL (1,000 mLs Intravenous New Bag/Given 09/20/24 0809)                                    Medical Decision Making Amount and/or Complexity of Data Reviewed Labs: ordered.  Risk Prescription drug management.   63 yo F with a chief complaints of abdominal pain and vomiting.  Going on since 1 AM.  Was given Zofran in triage with some improvement.  Will give IV fluids antiemetics check labs.  Reassess.  Mild leukocytosis.  Renal function at baseline.  LFTs and lipase are unremarkable.  Feeling much better.  Tolerate by mouth.  Will discharge home.  PCP follow-up.  9:18 AM:  I have discussed the diagnosis/risks/treatment options with the patient.  Evaluation and diagnostic testing in the emergency department does not suggest an emergent condition requiring admission or immediate intervention beyond what has been performed at this time.  They will follow up with PCP. We also discussed returning to the ED immediately if new or worsening sx occur. We discussed the sx which are most concerning (e.g.,  sudden worsening pain, fever, inability to tolerate by mouth) that necessitate immediate return. Medications administered to the patient during their visit and any new prescriptions provided to the patient are listed below.  Medications given during this visit Medications  ondansetron (ZOFRAN-ODT) disintegrating tablet 4 mg (4 mg Oral Given 09/20/24 0721)  metoCLOPramide (REGLAN) injection 10 mg (10 mg Intravenous Given 09/20/24 0808)  diphenhydrAMINE (BENADRYL) injection 25 mg (25 mg Intravenous Given 09/20/24 0805)  sodium chloride  0.9 % bolus  1,000 mL (1,000 mLs Intravenous New Bag/Given 09/20/24 0809)     The patient appears reasonably screen and/or stabilized for discharge and I doubt any other medical condition or other [facility] requiring further screening, evaluation, or treatment in the ED at this time prior to discharge.       Final diagnoses:  Nausea and vomiting in adult    ED Discharge Orders          Ordered    ondansetron  (ZOFRAN -ODT) 4 MG disintegrating tablet        09/20/24 0917               Emil Share, DO 09/20/24 [phone]

## 2024-09-20 NOTE — ED Triage Notes (Signed)
 Pt presents with c/o vomiting and abdominal pain since 1 am this morning. Pt denies any diarrhea. Pt also reports a hx of a hiatal hernia.

## 2024-09-20 NOTE — Discharge Instructions (Signed)
 Please return for worsening abdominal pain fever inability eat or drink.  Please call your family doctor today and let them know about your visit.

## 2024-09-21 ENCOUNTER — Telehealth: Payer: Self-pay | Admitting: *Deleted

## 2024-09-21 ENCOUNTER — Ambulatory Visit

## 2024-09-21 NOTE — Telephone Encounter (Signed)
 Pt scheduled for AWV today. No answer x 2 attempts. Left message to call and r/s.  Ok for [employer] to schedule on wellness visit 1 schedule for [employer].

## 2024-10-01 ENCOUNTER — Telehealth: Payer: Self-pay

## 2024-10-01 NOTE — Telephone Encounter (Signed)
 Dr. Jennefer name removed from chart as PCP, Pt has not yet seen Dr. Antonio. Meds will be refilled by [facility] until Pt is seen.

## 2024-10-01 NOTE — Telephone Encounter (Signed)
 Copied from CRM #[id number]. Topic: Clinical - Prescription Issue >> Oct 01, 2024 12:57 PM Zy'onna H wrote: Reason for CRM: Patient was originally under [place], but since her previous PCP left the practice she is now with Antonio Meth, Jamee SAUNDERS, DO. But she can't be seen by the PCP until early March 2026.   Due to this she wants to ensure that she will still be able to receive her medications/refills in a timely manner during the time between January and her New PT Visit.

## 2024-11-19 ENCOUNTER — Ambulatory Visit: Payer: Self-pay

## 2024-11-19 NOTE — Telephone Encounter (Signed)
 FYI Only or Action Required?: FYI only for provider: appointment scheduled on 1/20.  Patient was last seen in primary care on 06/05/2024 by Jason Leita Repine, FNP.  Called Nurse Triage reporting Shortness of Breath and Dizziness.  Symptoms began x 2 days ago .  Interventions attempted: Coricidin HP.  Symptoms are: unchanged.  Triage Disposition: See HCP Within 4 Hours (Or PCP Triage)  Patient/caregiver understands and will follow disposition?: Yes      Message from [facility] sent at 11/19/2024  9:36 AM EST  Reason for Triage: SOB, feeling dizzy. Started with a cold.   Reason for Disposition  [1] MILD difficulty breathing (e.g., minimal/no SOB at rest, SOB with walking, pulse < 100) AND [2] NEW-onset or WORSE than normal  Answer Assessment - Initial Assessment Questions 1. RESPIRATORY STATUS: Describe your breathing? (e.g., wheezing, shortness of breath, unable to speak, severe coughing)      SOB on exertion   2. ONSET: When did this breathing problem begin?      X 2 days   3. PATTERN Does the difficult breathing come and go, or has it been constant since it started?      Constant   4. SEVERITY: How bad is your breathing? (e.g., mild, moderate, severe)      Mild-Moderate  5. RECURRENT SYMPTOM: Have you had difficulty breathing before? If Yes, ask: When was the last time? and What happened that time?      No   6. CARDIAC HISTORY: Do you have any history of heart disease? (e.g., heart attack, angina, bypass surgery, angioplasty)      HTN   7. LUNG HISTORY: Do you have any history of lung disease?  (e.g., pulmonary embolus, asthma, emphysema)     No   8. CAUSE: What do you think is causing the breathing problem?      Possible bronchitis; unsure    9. OTHER SYMPTOMS: Do you have any other symptoms? (e.g., chest pain, cough, dizziness, fever, runny nose)     Dizziness- mild, low grade fever yesterday.      Patient called in to triage with  complaints of SOB on exertion, dizziness-mild.  This has been ongoing for 2 days  The patient stated she has shallow breathing, symptoms started with a cold. She suspects bronchitis.  For home care, the patient is taking Coricidin HP.  Appointment scheduled for further evaluation on 1/20; Patient agrees with the plan of care, and will reach out if symptoms worsen or persist. For the SOB if it worsens patient agrees to be seen in the ED today.  Protocols used: Breathing Difficulty-A-AH

## 2024-11-19 NOTE — Telephone Encounter (Signed)
 Appt scheduled

## 2024-11-20 ENCOUNTER — Ambulatory Visit: Admitting: Medical

## 2024-11-20 ENCOUNTER — Ambulatory Visit (HOSPITAL_BASED_OUTPATIENT_CLINIC_OR_DEPARTMENT_OTHER)
Admission: RE | Admit: 2024-11-20 | Discharge: 2024-11-20 | Disposition: A | Discharge status: Home / Self Care | Source: Ambulatory Visit | Attending: Medical | Admitting: Medical

## 2024-11-20 ENCOUNTER — Ambulatory Visit: Payer: Self-pay | Admitting: Medical

## 2024-11-20 VITALS — BP 158/86 | HR 91 | Temp 98.2°F | Resp 15 | Ht 67.0 in | Wt 239.6 lb

## 2024-11-20 DIAGNOSIS — R062 Wheezing: Secondary | ICD-10-CM

## 2024-11-20 DIAGNOSIS — R739 Hyperglycemia, unspecified: Secondary | ICD-10-CM

## 2024-11-20 DIAGNOSIS — R944 Abnormal results of kidney function studies: Secondary | ICD-10-CM

## 2024-11-20 DIAGNOSIS — R051 Acute cough: Secondary | ICD-10-CM

## 2024-11-20 DIAGNOSIS — J40 Bronchitis, not specified as acute or chronic: Secondary | ICD-10-CM | POA: Insufficient documentation

## 2024-11-20 LAB — CBC WITH DIFFERENTIAL/PLATELET
Basophils Absolute: 0.1 K/uL (ref 0.0–0.1)
Basophils Relative: 0.7 % (ref 0.0–3.0)
Eosinophils Absolute: 0.2 K/uL (ref 0.0–0.7)
Eosinophils Relative: 2.6 % (ref 0.0–5.0)
HCT: 38.5 % (ref 36.0–46.0)
Hemoglobin: 12.7 g/dL (ref 12.0–15.0)
Lymphocytes Relative: 38.7 % (ref 12.0–46.0)
Lymphs Abs: 3.3 K/uL (ref 0.7–4.0)
MCHC: 32.9 g/dL (ref 30.0–36.0)
MCV: 82.5 fl (ref 78.0–100.0)
Monocytes Absolute: 0.5 K/uL (ref 0.1–1.0)
Monocytes Relative: 5.6 % (ref 3.0–12.0)
Neutro Abs: 4.5 K/uL (ref 1.4–7.7)
Neutrophils Relative %: 52.4 % (ref 43.0–77.0)
Platelets: 316 K/uL (ref 150.0–400.0)
RBC: 4.66 Mil/uL (ref 3.87–5.11)
RDW: 14.6 % (ref 11.5–15.5)
WBC: 8.6 K/uL (ref 4.0–10.5)

## 2024-11-20 LAB — COMPREHENSIVE METABOLIC PANEL WITH GFR
ALT: 11 U/L (ref 3–35)
AST: 17 U/L (ref 5–37)
Albumin: 4.2 g/dL (ref 3.5–5.2)
Alkaline Phosphatase: 77 U/L (ref 39–117)
BUN: 14 mg/dL (ref 6–23)
CO2: 29 meq/L (ref 19–32)
Calcium: 9.4 mg/dL (ref 8.4–10.5)
Chloride: 102 meq/L (ref 96–112)
Creatinine, Ser: 1.04 mg/dL (ref 0.40–1.20)
GFR: 57.06 mL/min — ABNORMAL LOW
Glucose, Bld: 99 mg/dL (ref 70–99)
Potassium: 4.1 meq/L (ref 3.5–5.1)
Sodium: 139 meq/L (ref 135–145)
Total Bilirubin: 0.5 mg/dL (ref 0.2–1.2)
Total Protein: 7.1 g/dL (ref 6.0–8.3)

## 2024-11-20 LAB — POC COVID19/FLU A&B COMBO
Covid Antigen, POC: NEGATIVE
Influenza A Antigen, POC: NEGATIVE
Influenza B Antigen, POC: NEGATIVE

## 2024-11-20 LAB — HEMOGLOBIN A1C: Hgb A1c MFr Bld: 6.3 % (ref 4.6–6.5)

## 2024-11-20 MED ORDER — DOXYCYCLINE HYCLATE 100 MG PO TABS: 100.0000 mg | ORAL_TABLET | Freq: Two times a day (BID) | ORAL | 0 refills | Status: AC

## 2024-11-20 MED ORDER — BENZONATATE 100 MG PO CAPS: 100.0000 mg | ORAL_CAPSULE | Freq: Three times a day (TID) | ORAL | 0 refills | Status: AC | PRN

## 2024-11-20 MED ORDER — ALBUTEROL SULFATE HFA 108 (90 BASE) MCG/ACT IN AERS: 2.0000 | INHALATION_SPRAY | Freq: Four times a day (QID) | RESPIRATORY_TRACT | 0 refills | Status: AC | PRN

## 2024-11-20 MED ORDER — BUDESONIDE-FORMOTEROL FUMARATE 160-4.5 MCG/ACT IN AERO: 2.0000 | INHALATION_SPRAY | Freq: Two times a day (BID) | RESPIRATORY_TRACT | 0 refills | Status: AC

## 2024-11-20 NOTE — Progress Notes (Signed)
 "  Subjective:    Patient ID: Brandi Kerr, female    DOB: April 05, 1961, 64 y.o.   MRN: [id number]  HPI Brandi Kerr is a 64 year old female who presents with shortness of breath and cough.  Her symptoms started on Wednesday as a head cold for about 2 to 3 days, then moved to her chest. She has a clear, nonproductive cough that is unusual for her and significant shortness of breath with minimal exertion, such as walking from her car to her house or talking. She has slight lightheadedness at times but not on exam. No motor or neurologic signs/symptoms deficits reported.  She denies asthma and smoking but had heavy secondhand smoke exposure from her mother's smoking. She used an inhaler about five years ago for bronchitis. She has no calf pain, leg swelling, or pain behind the knee. She notices wheezing, especially at night and while driving.  She previously took oral prednisone for chronic pain and it caused depressed mood. She has no drug allergies, including to prednisone, but is cautious about its effect on her mood.   Review of Systems  Constitutional:  Negative for fatigue and fever.  HENT:  Negative for congestion.   Respiratory:  Positive for cough, shortness of breath and wheezing. Negative for chest tightness.   Cardiovascular:  Negative for chest pain and palpitations.  Gastrointestinal:  Negative for abdominal pain, constipation and nausea.  Genitourinary:  Negative for dysuria, frequency and urgency.  Musculoskeletal:  Negative for back pain, joint swelling and myalgias.  Skin:  Negative for rash.  Neurological:  Negative for dizziness, syncope and headaches.  Hematological:  Negative for adenopathy.  Psychiatric/Behavioral:  Negative for behavioral problems and decreased concentration.     Past Medical History:  Diagnosis Date   Anxiety    Bipolar disorder (HCC)    Degenerative disc disease at L5-S1 level    Fibromyalgia    GERD (gastroesophageal reflux disease)    Hiatal  hernia    High cholesterol    HTN (hypertension)    PTSD (post-traumatic stress disorder)    Sciatica      Social History   Socioeconomic History   Marital status: Divorced    Spouse name: Not on file   Number of children: Not on file   Years of education: Not on file   Highest education level: 12th grade  Occupational History   Not on file  Tobacco Use   Smoking status: Never   Smokeless tobacco: Never  Substance and Sexual Activity   Alcohol  use: Not Currently   Drug use: Not Currently    Types: Marijuana   Sexual activity: Not on file  Other Topics Concern   Not on file  Social History Narrative   Not on file   Social Drivers of Health   Tobacco Use: Low Risk (09/20/2024)   Patient History    Smoking Tobacco Use: Never    Smokeless Tobacco Use: Never    Passive Exposure: Not on file  Financial Resource Strain: Low Risk (06/04/2024)   Overall Financial Resource Strain (CARDIA)    Difficulty of Paying Living Expenses: Not hard at all  Food Insecurity: No Food Insecurity (06/04/2024)   Epic    Worried About Radiation Protection Practitioner of Food in the Last Year: Never true    Ran Out of Food in the Last Year: Never true  Transportation Needs: No Transportation Needs (06/04/2024)   Epic    Lack of Transportation (Medical): No    Lack of Transportation (  Non-Medical): No  Physical Activity: Insufficiently Active (06/04/2024)   Exercise Vital Sign    Days of Exercise per Week: 1 day    Minutes of Exercise per Session: 10 min  Stress: No Stress Concern Present (06/04/2024)   [employer] of Occupational Health - Occupational Stress Questionnaire    Feeling of Stress: Only a little  Social Connections: Unknown (06/04/2024)   Social Connection and Isolation Panel    Frequency of Communication with Friends and Family: More than three times a week    Frequency of Social Gatherings with Friends and Family: Twice a week    Attends Religious Services: Patient declined    Database Administrator  or Organizations: Patient declined    Attends Banker Meetings: Not on file    Marital Status: Divorced  Intimate Partner Violence: Not At Risk (02/26/2024)   Humiliation, Afraid, Rape, and Kick questionnaire    Fear of Current or Ex-Partner: No    Emotionally Abused: No    Physically Abused: No    Sexually Abused: No  Depression (PHQ2-9): Low Risk (05/06/2023)   Depression (PHQ2-9)    PHQ-2 Score: 0  Alcohol  Screen: Low Risk (11/24/2023)   Alcohol  Screen    Last Alcohol  Screening Score (AUDIT): 1  Housing: Unknown (06/04/2024)   Epic    Unable to Pay for Housing in the Last Year: No    Number of Times Moved in the Last Year: Not on file    Homeless in the Last Year: No  Utilities: Not At Risk (02/26/2024)   AHC Utilities    Threatened with loss of utilities: No  Health Literacy: Not on file    No past surgical history on file.  No family history on file.  Allergies[1]  Medications Ordered Prior to Encounter[2]  BP (!) 158/86   Pulse 91   Temp 98.2 F (36.8 C) (Oral)   Resp 15   Ht 5' 7 (1.702 m)   Wt 239 lb 9.6 oz (108.7 kg)   SpO2 96%   BMI 37.53 kg/m        Objective:   Physical Exam   General Mental Status- Alert. General Appearance- Not in acute distress.   Skin General: Color- Normal Color. Moisture- Normal Moisture.  Neck No JVD.  Chest and Lung Exam Auscultation: Breath Sounds:-CTA  Cardiovascular Auscultation:Rythm- RR Murmurs & Other Heart Sounds:Auscultation of the heart reveals- No Murmurs.  Abdomen Inspection:-Inspeection Normal. Palpation/Percussion:Note:No mass. Palpation and Percussion of the abdomen reveal- Non Tender, Non Distended + BS, no rebound or guarding.    Neurologic Cranial Nerve exam:- CN III-XII intact(No nystagmus), symmetric smile. Drift Test:- No drift. Romberg Exam:- Negative.  Heal to Toe Gait exam:-Normal. Finger to Nose:- Normal/Intact Strength:- 5/5 equal and symmetric strength both upper and  lower extremities.   Calfs- symmetric, negative homans signs, no pedal edema bilaterally.     Assessment & Plan:   Acute bronchitis with wheezing and acute cough Acute bronchitis with wheezing and productive cough. Oxygen saturation decreased on exertion. Differential includes pneumonia. Discussed potential use of oral steroids if symptoms worsen. Symbicort  inhaler chosen for inhaled steroid component. - Ordered CBC and metabolic panel. - Ordered chest x-ray. - Prescribed doxycycline  100 mg, one tablet twice a day for 10 days. - Prescribed benzonatate  for cough. - Prescribed Symbicort  inhaler, two inhalations twice a day. - Prescribed albuterol  inhaler as a backup. - Scheduled follow-up appointment on Friday at 1 PM. -Discussed differential if severe symptom worsening despite tx be seen  in -did not think d dimer or CT Angio indicated presently  Abnormal kidney function(low gfr) Decreased kidney function likely related to medications. - Ordered metabolic panel to monitor kidney function.  Hyperglycemia Intermittent hyperglycemia with normal A1c levels. - Ordered A1c test.  Follow up date to be determined after lab and xray review  Dallas Maxwell, PA-C   I personally spent a total of 40 minutes in the care of the patient today including performing a medically appropriate exam/evaluation, counseling and educating, placing orders, and documenting clinical information in the EHR.   Dallas Maxwell, PA-C     [1]  Allergies Allergen Reactions   Duloxetine  Hcl Other (See Comments)    Seizures   Escitalopram  Other (See Comments)    NO LEXAPRO  AT THIS TIME   Lithium Other (See Comments)    I do not allow Lithium   Tramadol  Hcl Other (See Comments)    Seizures    Venlafaxine Other (See Comments) and Hypertension    TOO HIGH OF A BLOOD PRESSURE ALMOST HAD A STROKE Pt states this medication will kill me   Atorvastatin Calcium  Other (See Comments)    Prefers to not take this-  wants only Crestor    Chocolate Nausea And Vomiting and Other (See Comments)    White chocolate only   Other Other (See Comments)    Do not ever give me Electroconvulsive therapy   Prednisone Other (See Comments)    Labile mood and not healthy for me   Caffeine Anxiety and Other (See Comments)  [2]  Current Outpatient Medications on File Prior to Visit  Medication Sig Dispense Refill   amLODipine  (NORVASC ) 10 MG tablet TAKE 1 TABLET BY MOUTH EVERY DAY 90 tablet 3   clonazePAM (KLONOPIN) 0.5 MG tablet Take 0.5 mg by mouth daily as needed.     Emollient (LUBRIDERM SERIOUSLY SENSITIVE) LOTN Apply 1 Application topically 2 (two) times daily.     lamoTRIgine  (LAMICTAL ) 200 MG tablet Take 200 mg by mouth at bedtime.     losartan  (COZAAR ) 100 MG tablet Take 1 tablet (100 mg total) by mouth daily. 90 tablet 1   meloxicam  (MOBIC ) 15 MG tablet Take 1 tablet (15 mg total) by mouth every Monday, Tuesday, Wednesday, Thursday, and Friday. DO NOT TAKE ON SATURDAYS OR SUNDAYS 90 tablet 0   ondansetron  (ZOFRAN -ODT) 4 MG disintegrating tablet 4mg  ODT q4 hours prn nausea/vomit 20 tablet 0   pantoprazole  (PROTONIX ) 40 MG tablet Take 1 tablet (40 mg total) by mouth 2 (two) times daily before a meal. 180 tablet 1   QUEtiapine  (SEROQUEL  XR) 300 MG 24 hr tablet Take 1 tablet (300 mg total) by mouth at bedtime.     REFRESH OPTIVE ADVANCED PF 0.5-1-0.5 % SOLN Place 1 drop into both eyes daily.     risperiDONE (RISPERDAL) 1 MG tablet Take 1 mg by mouth at bedtime. (Patient taking differently: Take 0.5 mg by mouth at bedtime.)     rosuvastatin  (CRESTOR ) 20 MG tablet Take 1 tablet (20 mg total) by mouth daily. 90 tablet 2   No current facility-administered medications on file prior to visit.   "

## 2024-11-20 NOTE — Patient Instructions (Signed)
 Acute bronchitis with wheezing and acute cough Acute bronchitis with wheezing and productive cough. Oxygen saturation decreased on exertion. Differential includes pneumonia. Discussed potential use of oral steroids if symptoms worsen. Symbicort  inhaler chosen for inhaled steroid component. - Ordered CBC and metabolic panel. - Ordered chest x-ray. - Prescribed doxycycline  100 mg, one tablet twice a day for 10 days. - Prescribed benzonatate  for cough. - Prescribed Symbicort  inhaler, two inhalations twice a day. - Prescribed albuterol  inhaler as a backup. - Scheduled follow-up appointment on Friday at 1 PM. -Discussed differential if severe symptom worsening despite tx be seen in -did not think d dimer or CT Angio indicated presently  Abnormal kidney function(low gfr) Decreased kidney function likely related to medications. - Ordered metabolic panel to monitor kidney function.  Hyperglycemia Intermittent hyperglycemia with normal A1c levels. - Ordered A1c test.  Follow up date to be determined after lab and xray review

## 2024-11-23 ENCOUNTER — Ambulatory Visit: Admitting: Medical

## 2024-11-23 ENCOUNTER — Encounter: Payer: Self-pay | Admitting: Medical

## 2024-11-23 VITALS — BP 138/87 | HR 81 | Temp 98.3°F | Resp 15 | Ht 67.0 in | Wt 236.4 lb

## 2024-11-23 DIAGNOSIS — J40 Bronchitis, not specified as acute or chronic: Secondary | ICD-10-CM

## 2024-11-23 DIAGNOSIS — R062 Wheezing: Secondary | ICD-10-CM | POA: Diagnosis not present

## 2024-11-23 DIAGNOSIS — R051 Acute cough: Secondary | ICD-10-CM | POA: Diagnosis not present

## 2024-11-23 NOTE — Progress Notes (Unsigned)
 "  Subjective:    Patient ID: Brandi Kerr, female    DOB: 03-Jul-1961, 64 y.o.   MRN: [id number]  HPI Brandi Kerr is a 64 year old female who presents with respiratory symptoms. See last visit note.  She has wheezing and a productive cough. Recent chest x-ray was clear. White blood cell count improved from 14.1 two months ago during a gastrointestinal illness to 8.6. Home oxygen saturation improved now most of reading up to 95% though mentioned sometimes sees 92%, Higher at rest. She uses doxycycline , benzonatate , Symbicort , and albuterol  as needed, and used albuterol  once when her oxygen was 92% with shortness of breath.  She denies fevers, chills, or sweats and feels partially improved. She has intermittent wheezing that she can hear and frequent productive coughing, which she feels helps. She denies calf, popliteal, or posterior knee pain.  Her cxr was negative on last visit and wbc count was not elevated.  She lives in [place], less than two miles from a local emergency department.   Review of Systems  Constitutional:  Negative for chills, fatigue and fever.  HENT:  Negative for congestion and ear pain.   Respiratory:  Positive for cough and wheezing. Negative for chest tightness and shortness of breath.   Cardiovascular:  Negative for chest pain and palpitations.  Gastrointestinal:  Negative for abdominal pain and constipation.  Genitourinary:  Negative for dyspareunia, enuresis and frequency.  Musculoskeletal:  Negative for back pain.  Skin:  Negative for rash.  Neurological:  Negative for dizziness.  Hematological:  Negative for adenopathy.  Psychiatric/Behavioral:  Negative for behavioral problems and decreased concentration.     Past Medical History:  Diagnosis Date   Anxiety    Bipolar disorder (HCC)    Degenerative disc disease at L5-S1 level    Fibromyalgia    GERD (gastroesophageal reflux disease)    Hiatal hernia    High cholesterol    HTN (hypertension)    PTSD  (post-traumatic stress disorder)    Sciatica      Social History   Socioeconomic History   Marital status: Divorced    Spouse name: Not on file   Number of children: Not on file   Years of education: Not on file   Highest education level: 12th grade  Occupational History   Not on file  Tobacco Use   Smoking status: Never   Smokeless tobacco: Never  Substance and Sexual Activity   Alcohol  use: Not Currently   Drug use: Not Currently    Types: Marijuana   Sexual activity: Not on file  Other Topics Concern   Not on file  Social History Narrative   Not on file   Social Drivers of Health   Tobacco Use: Low Risk (11/23/2024)   Patient History    Smoking Tobacco Use: Never    Smokeless Tobacco Use: Never    Passive Exposure: Not on file  Financial Resource Strain: Low Risk (06/04/2024)   Overall Financial Resource Strain (CARDIA)    Difficulty of Paying Living Expenses: Not hard at all  Food Insecurity: No Food Insecurity (06/04/2024)   Epic    Worried About Radiation Protection Practitioner of Food in the Last Year: Never true    Ran Out of Food in the Last Year: Never true  Transportation Needs: No Transportation Needs (06/04/2024)   Epic    Lack of Transportation (Medical): No    Lack of Transportation (Non-Medical): No  Physical Activity: Insufficiently Active (06/04/2024)   Exercise Vital Sign  Days of Exercise per Week: 1 day    Minutes of Exercise per Session: 10 min  Stress: No Stress Concern Present (06/04/2024)   [employer] of Occupational Health - Occupational Stress Questionnaire    Feeling of Stress: Only a little  Social Connections: Unknown (06/04/2024)   Social Connection and Isolation Panel    Frequency of Communication with Friends and Family: More than three times a week    Frequency of Social Gatherings with Friends and Family: Twice a week    Attends Religious Services: Patient declined    Database Administrator or Organizations: Patient declined    Attends Tax Inspector Meetings: Not on file    Marital Status: Divorced  Intimate Partner Violence: Not At Risk (02/26/2024)   Humiliation, Afraid, Rape, and Kick questionnaire    Fear of Current or Ex-Partner: No    Emotionally Abused: No    Physically Abused: No    Sexually Abused: No  Depression (PHQ2-9): Low Risk (05/06/2023)   Depression (PHQ2-9)    PHQ-2 Score: 0  Alcohol  Screen: Low Risk (11/24/2023)   Alcohol  Screen    Last Alcohol  Screening Score (AUDIT): 1  Housing: Unknown (06/04/2024)   Epic    Unable to Pay for Housing in the Last Year: No    Number of Times Moved in the Last Year: Not on file    Homeless in the Last Year: No  Utilities: Not At Risk (02/26/2024)   AHC Utilities    Threatened with loss of utilities: No  Health Literacy: Not on file    No past surgical history on file.  No family history on file.  Allergies[1]  Medications Ordered Prior to Encounter[2]  BP 138/87   Pulse 81   Temp 98.3 F (36.8 C) (Oral)   Resp 15   Ht 5' 7 (1.702 m)   Wt 236 lb 6.4 oz (107.2 kg)   SpO2 95%   BMI 37.03 kg/m         Objective:   Physical Exam  General- No acute distress. Pleasant patient. Neck- Full range of motion, no jvd Lungs-  even and unlabored.(Only minimal occasional fatin expiratory wheeze). Otherwise clear.  Heart- regular rate and rhythm. Neurologic- CNII- XII grossly intact.  Legs- calfs symmetric, negative homans signs. No pedal edema bilaterally.      Assessment & Plan:   Bronchitis with wheezing and acute cough Bronchitis with wheezing and acute cough, pneumonia ruled out by clear chest x-ray. WBC count improved from 2 months ago and now normal. Oxygen saturation stable at 95% with occasional drops. Symptoms improved by 40%. No  exam indicator of pulmonary embolism. Prefers to avoid oral steroids due to effect on her mood.(on review did not think d dimer or CT indicated) - Continue doxycycline , benzonatate , Symbicort  inhaler, and albuterol   PRN. - Monitor oxygen saturation; seek emergency care if close to or below 90% or severe shortness. - Avoid additional steroids unless symptoms worsen.( unless severely needed and pt agrees) - Send update via MyChart by Monday or Tuesday.  Please give me update on monday or tuesday how you are. Then decide if need additional appointment.   Keahi Mccarney, PA-C     [1]  Allergies Allergen Reactions   Duloxetine  Hcl Other (See Comments)    Seizures   Escitalopram  Other (See Comments)    NO LEXAPRO  AT THIS TIME   Lithium Other (See Comments)    I do not allow Lithium   Tramadol  Hcl Other (See  Comments)    Seizures    Venlafaxine Other (See Comments) and Hypertension    TOO HIGH OF A BLOOD PRESSURE ALMOST HAD A STROKE Pt states this medication will kill me   Atorvastatin Calcium  Other (See Comments)    Prefers to not take this- wants only Crestor    Chocolate Nausea And Vomiting and Other (See Comments)    White chocolate only   Other Other (See Comments)    Do not ever give me Electroconvulsive therapy   Prednisone Other (See Comments)    Labile mood and not healthy for me   Caffeine Anxiety and Other (See Comments)  [2]  Current Outpatient Medications on File Prior to Visit  Medication Sig Dispense Refill   albuterol  (VENTOLIN  HFA) 108 (90 Base) MCG/ACT inhaler Inhale 2 puffs into the lungs every 6 (six) hours as needed. 18 g 0   amLODipine  (NORVASC ) 10 MG tablet TAKE 1 TABLET BY MOUTH EVERY DAY 90 tablet 3   benzonatate  (TESSALON ) 100 MG capsule Take 1 capsule (100 mg total) by mouth 3 (three) times daily as needed for cough. 30 capsule 0   budesonide -formoterol  (SYMBICORT ) 160-4.5 MCG/ACT inhaler Inhale 2 puffs into the lungs 2 (two) times daily. 1 each 0   clonazePAM (KLONOPIN) 0.5 MG tablet Take 0.5 mg by mouth daily as needed.     doxycycline  (VIBRA -TABS) 100 MG tablet Take 1 tablet (100 mg total) by mouth 2 (two) times daily. 20 tablet 0   Emollient (LUBRIDERM  SERIOUSLY SENSITIVE) LOTN Apply 1 Application topically 2 (two) times daily.     lamoTRIgine  (LAMICTAL ) 200 MG tablet Take 200 mg by mouth at bedtime.     losartan  (COZAAR ) 100 MG tablet Take 1 tablet (100 mg total) by mouth daily. 90 tablet 1   meloxicam  (MOBIC ) 15 MG tablet Take 1 tablet (15 mg total) by mouth every Monday, Tuesday, Wednesday, Thursday, and Friday. DO NOT TAKE ON SATURDAYS OR SUNDAYS 90 tablet 0   ondansetron  (ZOFRAN -ODT) 4 MG disintegrating tablet 4mg  ODT q4 hours prn nausea/vomit 20 tablet 0   pantoprazole  (PROTONIX ) 40 MG tablet Take 1 tablet (40 mg total) by mouth 2 (two) times daily before a meal. 180 tablet 1   QUEtiapine  (SEROQUEL  XR) 300 MG 24 hr tablet Take 1 tablet (300 mg total) by mouth at bedtime.     REFRESH OPTIVE ADVANCED PF 0.5-1-0.5 % SOLN Place 1 drop into both eyes daily.     risperiDONE (RISPERDAL) 1 MG tablet Take 1 mg by mouth at bedtime. (Patient taking differently: Take 0.5 mg by mouth at bedtime.)     rosuvastatin  (CRESTOR ) 20 MG tablet Take 1 tablet (20 mg total) by mouth daily. 90 tablet 2   No current facility-administered medications on file prior to visit.   "

## 2024-11-23 NOTE — Patient Instructions (Signed)
 Bronchitis with wheezing and acute cough Bronchitis with wheezing and acute cough, pneumonia ruled out by clear chest x-ray. WBC count improved from 2 months ago and now normal. Oxygen saturation stable at 95% with occasional drops. Symptoms improved by 40%. No  exam indicator of pulmonary embolism. Prefers to avoid oral steroids due to effect on her mood - Continue doxycycline , benzonatate , Symbicort  inhaler, and albuterol  PRN. - Monitor oxygen saturation; seek emergency care if close to or below 90% or severe shortness. - Avoid additional steroids unless symptoms worsen.(severely needed and pt agrees) - Send update via MyChart by Monday or Tuesday.  Please give me update on monday or tuesday how you are. Then decide if need additional appointment.

## 2024-11-26 ENCOUNTER — Encounter: Payer: Self-pay | Admitting: Medical

## 2024-11-27 ENCOUNTER — Other Ambulatory Visit: Payer: Self-pay

## 2024-11-27 ENCOUNTER — Telehealth: Payer: Self-pay

## 2024-11-27 MED ORDER — ROSUVASTATIN CALCIUM 20 MG PO TABS: 20.0000 mg | ORAL_TABLET | Freq: Every day | ORAL | 2 refills | Status: AC

## 2024-11-27 NOTE — Telephone Encounter (Signed)
 Copied from CRM #[id number]. Topic: Clinical - Medication Refill >> Nov 26, 2024  2:19 PM Chasity T wrote: Medication: losartan  (COZAAR ) 100 MG tablet   Has the patient contacted their pharmacy? Yes  This is the patient's preferred pharmacy:  [id number] [place], [place] - [address] [place] [address] Phone: [phone] Fax: [phone]  Is this the correct pharmacy for this prescription? Yes If no, delete pharmacy and type the correct one.   Has the prescription been filled recently? No  Is the patient out of the medication? No  Has the patient been seen for an appointment in the last year OR does the patient have an upcoming appointment? Yes  Can we respond through MyChart? Yes  Agent: Please be advised that Rx refills may take up to 3 business days. We ask that you follow-up with your pharmacy.

## 2024-12-07 ENCOUNTER — Encounter: Admitting: Family Medicine

## 2024-12-07 ENCOUNTER — Ambulatory Visit: Admitting: Family

## 2024-12-07 ENCOUNTER — Ambulatory Visit: Admitting: Family Medicine

## 2025-01-03 ENCOUNTER — Encounter: Admitting: Family Medicine
